# Patient Record
Sex: Male | Born: 1935 | Race: White | Hispanic: No | Marital: Married | Smoking: Never smoker
Health system: Southern US, Community
[De-identification: ages and names within clinical notes are randomized; demographics above are authoritative.]

## PROBLEM LIST (undated history)

## (undated) DIAGNOSIS — F0280 Dementia in other diseases classified elsewhere without behavioral disturbance: Secondary | ICD-10-CM

## (undated) DIAGNOSIS — M1990 Unspecified osteoarthritis, unspecified site: Secondary | ICD-10-CM

## (undated) DIAGNOSIS — G301 Alzheimer's disease with late onset: Secondary | ICD-10-CM

## (undated) DIAGNOSIS — K5790 Diverticulosis of intestine, part unspecified, without perforation or abscess without bleeding: Secondary | ICD-10-CM

## (undated) DIAGNOSIS — Z86010 Personal history of colonic polyps: Secondary | ICD-10-CM

## (undated) DIAGNOSIS — N200 Calculus of kidney: Secondary | ICD-10-CM

## (undated) DIAGNOSIS — C61 Malignant neoplasm of prostate: Secondary | ICD-10-CM

## (undated) DIAGNOSIS — J329 Chronic sinusitis, unspecified: Secondary | ICD-10-CM

## (undated) DIAGNOSIS — I10 Essential (primary) hypertension: Secondary | ICD-10-CM

## (undated) DIAGNOSIS — Z860101 Personal history of adenomatous and serrated colon polyps: Secondary | ICD-10-CM

## (undated) DIAGNOSIS — IMO0002 Reserved for concepts with insufficient information to code with codable children: Secondary | ICD-10-CM

## (undated) HISTORY — DX: Personal history of colonic polyps: Z86.010

## (undated) HISTORY — DX: Malignant neoplasm of prostate: C61

## (undated) HISTORY — PX: TONSILLECTOMY AND ADENOIDECTOMY: SUR1326

## (undated) HISTORY — DX: Reserved for concepts with insufficient information to code with codable children: IMO0002

## (undated) HISTORY — DX: Alzheimer's disease with late onset: G30.1

## (undated) HISTORY — DX: Calculus of kidney: N20.0

## (undated) HISTORY — DX: Diverticulosis of intestine, part unspecified, without perforation or abscess without bleeding: K57.90

## (undated) HISTORY — PX: SKIN CANCER EXCISION: SHX779

## (undated) HISTORY — DX: Personal history of adenomatous and serrated colon polyps: Z86.0101

## (undated) HISTORY — PX: APPENDECTOMY: SHX54

## (undated) HISTORY — DX: Chronic sinusitis, unspecified: J32.9

## (undated) HISTORY — PX: PROSTATECTOMY: SHX69

## (undated) HISTORY — PX: OTHER SURGICAL HISTORY: SHX169

## (undated) HISTORY — PX: CATARACT EXTRACTION: SUR2

## (undated) HISTORY — PX: TOTAL KNEE ARTHROPLASTY: SHX125

## (undated) HISTORY — PX: KNEE SURGERY: SHX244

## (undated) HISTORY — DX: Unspecified osteoarthritis, unspecified site: M19.90

## (undated) HISTORY — DX: Dementia in other diseases classified elsewhere, unspecified severity, without behavioral disturbance, psychotic disturbance, mood disturbance, and anxiety: F02.80

## (undated) HISTORY — DX: Essential (primary) hypertension: I10

---

## 2002-11-06 ENCOUNTER — Encounter: Payer: Self-pay | Admitting: Orthopedic Surgery

## 2002-11-12 ENCOUNTER — Encounter: Payer: Self-pay | Admitting: Orthopedic Surgery

## 2002-11-12 ENCOUNTER — Inpatient Hospital Stay (HOSPITAL_COMMUNITY): Admission: RE | Admit: 2002-11-12 | Discharge: 2002-11-17 | Payer: Self-pay | Admitting: Orthopedic Surgery

## 2002-11-15 ENCOUNTER — Encounter: Payer: Self-pay | Admitting: Orthopedic Surgery

## 2002-11-16 ENCOUNTER — Encounter: Payer: Self-pay | Admitting: Orthopedic Surgery

## 2002-12-24 ENCOUNTER — Inpatient Hospital Stay (HOSPITAL_COMMUNITY): Admission: RE | Admit: 2002-12-24 | Discharge: 2002-12-28 | Payer: Self-pay | Admitting: Orthopedic Surgery

## 2003-11-27 ENCOUNTER — Ambulatory Visit (HOSPITAL_COMMUNITY): Admission: RE | Admit: 2003-11-27 | Discharge: 2003-11-27 | Payer: Self-pay | Admitting: Chiropractic Medicine

## 2005-01-14 ENCOUNTER — Ambulatory Visit: Payer: Self-pay | Admitting: Internal Medicine

## 2005-02-08 ENCOUNTER — Encounter (INDEPENDENT_AMBULATORY_CARE_PROVIDER_SITE_OTHER): Payer: Self-pay | Admitting: Specialist

## 2005-02-08 ENCOUNTER — Ambulatory Visit: Payer: Self-pay | Admitting: Internal Medicine

## 2006-03-28 ENCOUNTER — Emergency Department (HOSPITAL_COMMUNITY): Admission: EM | Admit: 2006-03-28 | Discharge: 2006-03-29 | Payer: Self-pay | Admitting: Emergency Medicine

## 2007-12-05 ENCOUNTER — Ambulatory Visit: Payer: Self-pay | Admitting: Urology

## 2008-12-09 ENCOUNTER — Ambulatory Visit (HOSPITAL_COMMUNITY): Admission: RE | Admit: 2008-12-09 | Discharge: 2008-12-09 | Payer: Self-pay | Admitting: Urology

## 2009-09-20 ENCOUNTER — Emergency Department (HOSPITAL_COMMUNITY): Admission: EM | Admit: 2009-09-20 | Discharge: 2009-09-20 | Payer: Self-pay | Admitting: Emergency Medicine

## 2010-02-11 ENCOUNTER — Encounter: Payer: Self-pay | Admitting: Internal Medicine

## 2010-03-05 NOTE — Letter (Signed)
Summary: Colonoscopy Letter  [facility]  [address] [place], [place] [address]   Phone: [phone]  Fax: [phone]      February 11, 2010 MRN: [id number]   [facility] [address] [place], [place]  [address]   Dear Mr. Molla,   According to your medical record, it is time for you to schedule a Colonoscopy. The American Cancer Society recommends this procedure as a method to detect early colon cancer. Patients with a family history of colon cancer, or a personal history of colon polyps or inflammatory bowel disease are at increased risk.  This letter has been generated based on the recommendations made at the time of your procedure. If you feel that in your particular situation this may no longer apply, please contact our office.  Please call our office at [phone] to schedule this appointment or to update your records at your earliest convenience.  Thank you for cooperating with [place] to provide you with the very best care possible.   Sincerely,  Hedwig Morton. Juanda Chance, M.D.  [facility] [phone]

## 2010-06-19 NOTE — Op Note (Signed)
NAMEMANSOOR, HILLYARD                          ACCOUNT NO.:  [id number]   MEDICAL RECORD NO.:  [id number]                   PATIENT TYPE:  INP   LOCATION:  2550                                 FACILITY:  [facility]   PHYSICIAN:  Loreta Ave, M.D.              DATE OF BIRTH:  [birth year]   DATE OF PROCEDURE:  11/12/2002  DATE OF DISCHARGE:                                 OPERATIVE REPORT   PREOPERATIVE DIAGNOSES:  End-stage degenerative arthritis with marked varus  alignment and flexion contracture of right knee.   POSTOPERATIVE DIAGNOSES:  End-stage degenerative arthritis with marked varus  alignment and flexion contracture of right knee.   PROCEDURES:  Right total knee replacement, Osteonics prosthesis, soft tissue  balancing with medial capsular and posterior capsular release, a cemented #9  posterior stabilized femoral component, cemented #11 tibial component with  12 mm posterior stabilized Flex polyethylene insert, cemented recessed 28 mm  patellar component.   SURGEON:  Loreta Ave, M.D.   ASSISTANT:  Arlys John D. Petrarca, P.A.-C.   ANESTHESIA:  General.   ESTIMATED BLOOD LOSS:  Minimal.   TOURNIQUET TIME:  One hour and 30 minutes.   SPECIMENS:  Bone and soft tissue.   CULTURES:  None.   COMPLICATIONS:  None.   DRESSING:  Soft compressive, knee immobilizer.   DRAINS:  Hemovac x2.   DESCRIPTION OF PROCEDURE:  The patient was brought to the operating room and  placed on the operating room table in the supine position.  After adequate  anesthesia had been obtained, the right knee was examined.  Ten degrees of  varus, ten degrees of flexion contracture, both relatively fixed.  Further  flexion to a little less than 90 degrees.  The tourniquet was applied.  Prepped and draped in the usual sterile fashion.  Exsanguination with an  Esmarch tourniquet inflated to 350 mmHg.  A straight incision above the  patella, down to the tibial tubercle.  The skin and  subcutaneous tissue  divided.  A medial arthrotomy.  The knee exposed with marked spurring and  loose bodies, and capsular debris and remnants of menisci.  The cruciate  ligament was all removed.  A generous medial capsular release.  The distal  femur was exposed.  Intramedullary guide placed.  The distal cup removing 5  mm, set at 5 degrees of valgus.  Sized to a #9 component.  The jigs were put  in place.  Definite cuts made.  A trial put in place and found to fit well.  The tibia was exposed.  The tibial spine removed with a saw.  The  periarticular spurs were removed.  The proximal cut, 5 degree posterior  slope cut, bringing it down just below the sclerotic chondral area of bone  loss at the medial plateau.  Able to keep the section above the fibular  head.  Once the cut was completely excised, a #11 component.  The patella  was then sized, reamed, and drilled for a 28 mm component.  The trials put  in place throughout.  With the 12 mm insert, and after the removal of all  spurs and loose bodies, a medial capsular release, as well as posterior  capsular release, I had full extension, full flexion, nicely balanced knee  set at 5 degrees of valgus, with acceptable femoral tracking.  The tibia was  marked for appropriate rotation and reamed.  All trials were removed.  Copious irrigation with the pulse irrigating device.  Cement prepared and  placed on all components, to ensure that they were firmly seated, with a  polyethylene attached to the tibial component.  The knee was reduced.  Once  the cement hardened, the knee was re-examined.  Full extension, full  flexion.  No lift-off of the flexion, and it had good patellofemoral  tracking.  A Hemovac was placed and brought out through a separate stab  wound.  The arthrotomy was closed with a #1 Vicryl.  The skin and  subcutaneous tissue with Vicryl and staples.  The margins of the wounds of  the knee were injected with Marcaine.  A Hemovac  was clamped.  A sterile  compressive dressing applied.  The tourniquet deflated and removed.  A knee  immobilizer applied.  The anesthesia was reversed.  The patient was brought  to the recovery room.  He tolerated the surgery well with no complications.                                               Loreta Ave, M.D.    DFM/MEDQ  D:  11/12/2002  T:  11/12/2002  Job:  [id number]

## 2010-06-19 NOTE — Op Note (Signed)
Casey Moore, Casey Moore                        ACCOUNT NO.:  [id number]   MEDICAL RECORD NO.:  [id number]                   PATIENT TYPE:  INP   LOCATION:  2550                                 FACILITY:  [facility]   PHYSICIAN:  Loreta Ave, M.D.              DATE OF BIRTH:  Jan 05, 1932   DATE OF PROCEDURE:  12/24/2002  DATE OF DISCHARGE:                                 OPERATIVE REPORT   PREOPERATIVE DIAGNOSES:  Degenerative arthritis with flexion contracture,  varus alignment, and reduced flexion of the left knee.   POSTOPERATIVE DIAGNOSES:  Degenerative arthritis with flexion contracture,  varus alignment, and reduced flexion of the left knee.   OPERATIVE PROCEDURE:  Left total knee replacement utilizing Osteonics  prosthesis.  Soft tissue balancing with medial capsular and lateral  retinacular release.  Cemented #9 posterior-stabilized femoral component.  Cemented #11 tibial component with 12-mm posterior-stabilized flex insert.  Cemented recessed 28-mm patellar component.   SURGEON:  Loreta Ave, M.D.   ASSISTANT:  Arlys John D. Petrarca, P.A.-C.   ANESTHESIA:  General.   BLOOD LOSS:  Minimal.   TOURNIQUET TIME:  One hour and 20 minutes.   SPECIMENS:  Excised bone and soft tissue.   CULTURES:  None.   COMPLICATIONS:  None.   DRESSING:  Soft compressive with knee immobilizer.   DRAIN:  Hemovac x2.   PROCEDURE:  The patient was brought to the operating room and after adequate  anesthesia had been obtained, the left knee was examined.  A 5-degree  flexion contracture, 5-degree varus not event correctable to neutral.  Stable ligaments with further flexion to 90 degrees.  Tourniquet applied,  prepped and draped in the usual sterile fashion.  Exsanguinated with  elevation and an Esmarch, tourniquet inflated to 350 mmHg.  Straight  incision above the patella down to the tibial tubercle.  Hemostasis with  cautery.  Medial parapatellar arthrotomy.  Grade 4 changes  throughout.  Remnants of menisci, cruciate ligaments, para-articular spurs, loose bodies,  hypertrophic synovitis all debrided.  Distal femur exposed.  Intramedullary  guide placed.  Relatively wide.  Distal cuts set at 5 degrees of valgus,  removing 10 mm.  Sized for a #9 component.  Although this was narrow medial  to lateral, it was the exact right size from anterior to posterior.  Jigs  put in place and definitive cuts made.  Trial for the #9 component fit well.  Trial removed.  Attention turned to the tibia.  Tibial spine removed with  the saw.  Intramedullary guide placed.  Proximal cut at 5 degrees of  posterior slope removing 4 mm off the deficient medial side.  Patella sized  using the drill for a 28-mm component after para-articular spurs removed.  At completion, trials put in place.  A #9 on the femur which fit well, a #11  on the tibia which had good sizing.  A 28 mm on the patella, with  the 12-mm  flex posterior-stabilized insert, I had full extension and flexion nicely,  balance of knee set up 5 degrees of valgus after medial capsular release.  Lateral retinacular release performed with cautery from inside out.  It was  necessary to balance the patellofemoral joint.  Once completed, I had good  tracking.  Tibia was marked for appropriate rotation and then reamed.  All  trials removed.  Copious irrigation with the pulsed irrigating device.  Cement prepared, placed on all components which were firmly seated,  excessive cement removed.  Polyethylene attached to the tibia.  Knee  reduced.  Once the cement had hardened, the knee was re-examined.  Full  extension and full flexion, a nicely balanced knee with good stability and  good patellofemoral tracking.  Hemovacs placed and brought out through a  separate stab wound.  The wound was closed with #1 Vicryl, skin and  subcutaneous tissue with Vicryl and staples.  The Hemovacs were clamped.  The knee incision was injected with  Marcaine.  Sterile compressive dressing  applied.  Tourniquet inflator removed.  Knee immobilizer applied.  Anesthesia reversed.  Brought to the recovery room.  He tolerated the  surgery well with no complications.                                               Loreta Ave, M.D.    DFM/MEDQ  D:  12/24/2002  T:  12/24/2002  Job:  [id number]

## 2010-06-19 NOTE — Discharge Summary (Signed)
Casey Moore, Casey Moore                        ACCOUNT NO.:  [id number]   MEDICAL RECORD NO.:  [id number]                   PATIENT TYPE:  INP   LOCATION:  5035                                 FACILITY:  [facility]   PHYSICIAN:  Loreta Ave, M.D.              DATE OF BIRTH:  [birth year]   DATE OF ADMISSION:  12/24/2002  DATE OF DISCHARGE:  12/28/2002                                 DISCHARGE SUMMARY   ADMISSION DIAGNOSIS:  Advanced degenerative joint disease of the left knee.   DISCHARGE DIAGNOSES:  1. Advanced degenerative joint disease of the left knee.  2. History of hypertension.  3. Status post right total knee replacement.   PROCEDURE:  Left total knee replacement.   HISTORY OF PRESENT ILLNESS:  A 77 year old white male status post right  total knee replacement with progressive pain and discomfort in the left  knee.  He has continued to have pain with difficulty with activities of  daily living on the left knee.  He is having nighttime pain.  He has failed  with conservative treatment.  He is now indicated for a left total knee  replacement.   HOSPITAL COURSE:  A 77 year old white male admitted December 24, 2002, after  appropriate laboratory studies were obtained as well as 1 g of Ancef IV on  call to the operating room, was taken to the operating room where he  underwent a left total knee replacement.  He tolerated the procedure well.  He was placed on a low dose Dilaudid PCA pump.  Ancef 1 g IV q.8h. x3 doses  was given.  Heparin 5000 units subcu q.12h. was given until his Coumadin  became therapeutic.  Foley was placed intraoperatively.  Hemovacs were also  placed intraoperatively.  Consultations with PT and OT were made.  Ambulation weightbearing as tolerated on the left.  CPM 0 to 30 degrees for  eight hours per day.   He was allowed out of bed to a chair the following day.  Weaned to oral pain  medications over the first two postoperative days.  He was placed with  30 mL  of Maalox p.r.n. indigestion.  Robaxin 500 mg q.6-8h. p.r.n. spasms.   His Hemovacs were pulled on December 26, 2002, and his dressings were  changed.  He had an otherwise unremarkable hospital course.   He was discharged on December 28, 2002.  He will return for follow-up in the  next 10 to 14 days.   Radiographic studies of the left knee reveals normal alignment following  left knee arthroplasty.   LABORATORY DATA:  Admitted with hemoglobin of 12.5, hematocrit 36.5%, white  count 6200, platelets 248,000.  Discharge hemoglobin 7.9, hematocrit 23.2,  white count 6700, platelets 211,000.  Discharge protime was 18.5 with an INR  of 1.9.  Preoperative sodium 134, potassium 3.6, chloride 101, CO2 24,  glucose 138, BUN 19, creatinine 1.1, calcium 9.6.  Total protein  6.9,  albumin 3.7, AST 24, ALT 17, ALP 141, total bilirubin 0.6.  Serum osmolality  was 290.  Discharge sodium 134, potassium 4.4, chloride 100, CO2 28, glucose  130, BUN 10, creatinine 0.9, calcium 9.0.  Urinalysis showed 100 protein,  otherwise unremarkable except for hyalin casts.  Blood type O positive,  antibody screen negative.   DISCHARGE INSTRUCTIONS:  1. Given a prescription for Percocet 5/325 one to two tablets every four     hours as needed for pain.  2. Coumadin 5 mg daily as directed by [facility].  3. Colace 100 mg twice a day.  4. Iron sulfate 325 mg twice a day with meals.  5. Activity as taught in physical therapy.  6. No restrictions on diet.  7. Keep his wound clean and dry and cover daily with a dressing.  8. Follow blue instruction sheet.  9. Follow-up in the office in 10 to 14 days for recheck evaluation.      Casey Moore, P.A.-C.                Loreta Ave, M.D.    BDP/MEDQ  D:  01/14/2003  T:  01/15/2003  Job:  [id number]

## 2010-06-19 NOTE — Discharge Summary (Signed)
Casey Moore, Casey Moore                        ACCOUNT NO.:  [id number]   MEDICAL RECORD NO.:  [id number]                   PATIENT TYPE:  INP   LOCATION:  5025                                 FACILITY:  [facility]   PHYSICIAN:  Loreta Ave, M.D.              DATE OF BIRTH:  12-Dec-1931   DATE OF ADMISSION:  11/12/2002  DATE OF DISCHARGE:  11/17/2002                                 DISCHARGE SUMMARY   ADMISSION DIAGNOSES:  Advanced degenerative joint disease of the right knee.   DISCHARGE DIAGNOSES:  1. Advanced degenerative joint disease of the right knee.  2. History of hypertension.  3. Prostate carcinoma.   PROCEDURE:  Right total knee replacement.   HISTORY:  A 77 year old white male with progressive pain and discomfort  right knee.  He is now having difficulty with activities of daily living as  well as recreational activities.  Also, is noted to have nighttime and rest  pain.  He has failed with conservative treatment.  He is now indicated for  total knee replacement on the right.   HOSPITAL COURSE:  A 77 year old white male admitted November 12, 2002 after  appropriate laboratory studies were obtained as well as 1 g Ancef IV on-call  to the operating room.  Was taken to the operating room where he underwent a  right total knee replacement.  He tolerated the procedure well.  He was  placed on a low dose Dilaudid PCA pump.  Ancef 1 g Iv q.8h. x3 doses was  continued.  Heparin 5000 units subcutaneous q.12h. until Coumadin became  therapeutic.  Consultation with PT, OT, and rehabilitation.  Ambulation,  weightbearing as tolerated on the right.  CPM 0-60 degrees 8-10 hours per  day.  Foley was placed intraoperatively.  He was allowed out of bed to a  chair the following day.  Robaxin 500 mg p.o. q.6-8h. p.r.n. was used for  spasms.  Mylanta 30 mL p.o. p.r.n. indigestion was also used.  On the 13th  his Hemovacs were pulled as well as his Foley.  His IV was heplocked.  He  also  had a hemoglobin A1C drawn on that day.  On the 14th he did develop a  mild ileus with some hypokalemia.  Senokot was used one p.o. b.i.d. one-half  hour a.c.  Also, a KUB was ordered.  Potassium chloride of 20 mEq p.o.  b.i.d. was started x24 hours.  The following day he was placed on Reglan 10  mg p.o. t.i.d.  Chest x-ray and KUB were also ordered.  A urinalysis clean  catch was also ordered for temperature elevation.  He had improvement in his  ileus as well as his temperature.  He was discharged on the 16th in improved  condition.  He will follow back up in our office in 10-14 days for recheck  evaluation.  EKG revealed normal sinus rhythm.  Radiographic studies from  November 12, 2002 right knee revealed good position AP and lateral right  total knee portable views.  October 14 abdominal series revealed findings  are most compatible with a moderate ileus.  October 15 chest x-ray revealed  minimal linear scarring or atelectasis at the lung bases.  His abdominal  film showed some interval decrease in small bowel and colonic distention  since November 15, 2002.   Laboratory studies:  Admitted with a hemoglobin 13.6, hematocrit 39.8%,  white count 6000, platelets 206,000.  Discharge pro time was 25.0 with an  INR of 3.3.  Preoperative chemistries:  Sodium 145, potassium 4.5, chloride  110, CO2 25, glucose 89, BUN 28, creatinine 1.0, calcium 10.0.  Total  protein 6.9, albumin 4.1, AST 35, ALT 30, ALP 194, total bilirubin 0.6.  Serum osmolality was 298.  Discharge sodium 135, potassium 3.8, chloride  100, CO2 26, glucose 123, BUN 20, creatinine 1.0, calcium 9.0.  Total  protein 5.8, albumin 2.5, AST 32, ALT 23, ALP 164, total bilirubin 0.7.  Glycosylated hemoglobin was 5.3.  Urinalysis October 5 was benign.  October  15 revealed small amount of bilirubin and hemoglobin, 15 ketones, protein  100, urobilinogen 0.2, wbc's 0-2, red 0-2, bacteria rare, and there was  mucus noted.  Blood type O+.   Antibody screen negative.   DISCHARGE INSTRUCTIONS:   DISCHARGE MEDICATIONS:  1. Given a prescription for Percocet 5/325 one to two tablets p.o. q.4h.     p.r.n. pain.  2. Coumadin 5 mg p.o. as directed by Casey Moore.  3. Colace 100 mg p.o. b.i.d.  4. Iron sulfate 325 mg b.i.d. with food.   ACTIVITY:  As taught in physical therapy.   DIET:  No restrictions.   WOUND CARE:  Keep his wound clean and dry and cover with a dressing daily.  Follow the blue instruction sheet.   FOLLOWUP:  Follow back up with [place] in 10-14 days for recheck evaluation.      Oris Drone Petrarca, P.A.-C.                Loreta Ave, M.D.    BDP/MEDQ  D:  01/14/2003  T:  01/15/2003  Job:  [id number]

## 2010-09-10 LAB — HEMOCCULT GUIAC POC 1CARD (OFFICE)

## 2010-10-13 ENCOUNTER — Encounter: Payer: Self-pay | Admitting: Internal Medicine

## 2010-10-29 ENCOUNTER — Encounter: Payer: Self-pay | Admitting: Internal Medicine

## 2010-11-03 ENCOUNTER — Ambulatory Visit (INDEPENDENT_AMBULATORY_CARE_PROVIDER_SITE_OTHER): Payer: 59 | Admitting: Internal Medicine

## 2010-11-03 ENCOUNTER — Encounter: Payer: Self-pay | Admitting: Internal Medicine

## 2010-11-03 VITALS — BP 110/60 | HR 60 | Ht 68.5 in | Wt 198.2 lb

## 2010-11-03 DIAGNOSIS — Z86010 Personal history of colonic polyps: Secondary | ICD-10-CM

## 2010-11-03 DIAGNOSIS — R195 Other fecal abnormalities: Secondary | ICD-10-CM

## 2010-11-03 MED ORDER — PEG-KCL-NACL-NASULF-NA ASC-C 100 G PO SOLR: 1.0000 | Freq: Once | ORAL | Status: DC

## 2010-11-03 NOTE — Patient Instructions (Signed)
You have been scheduled for a colonoscopy. Please follow written instructions given to you at your visit today.  Please pick up your Moviprep kit at the pharmacy within the next 2-3 days. CC:Dr Richard [email or web address]

## 2010-11-03 NOTE — Progress Notes (Signed)
Casey Moore 04/24/1931 MRN [id number]     History of Present Illness:  This is a 77 year old white male with Hemoccult positive stool on a home test. He has a history of an adenomatous polyp of the colon on a colonoscopy in January 2007. It was an 8 mm tubular adenoma. He denies any symptoms of constipation, abdominal pain or visible rectal bleeding. He has a history of prostate cancer at least 20 years ago. He is status post radical prostatectomy. He denies having pelvic radiation. He has kidney stones, gout and senile dementia. His wife comes with him.   Past Medical History  Diagnosis Date  . Osteoarthritis   . Hypertension   . Hx of adenomatous colonic polyps   . Diverticulosis   . Prostate cancer   . Paget's disease   . Kidney stone   . Sinusitis   . SDAT (senile dementia of Alzheimer's type)    Past Surgical History  Procedure Date  . Cataract extraction     left-with implant  . Total knee arthroplasty     bilateral  . Mastoid surgery     x 5  . Tonsillectomy and adenoidectomy   . Appendectomy   . Knee surgery     bilateral  . Prostatectomy   . Skin cancer excision     right hip    reports that he has never smoked. He has never used smokeless tobacco. He reports that he drinks alcohol. He reports that he does not use illicit drugs. family history includes Dementia in his mother; Prostate cancer in his brother and father; and Throat cancer in his paternal grandfather.  There is no history of Colon cancer. No Known Allergies      Review of Systems: Denies dysphagia, odynophagia, shortness of breath or chest pain  The remainder of the 10 point ROS is negative except as outlined in H&P   Physical Exam: General appearance  Well developed, in no distress. Answers appropriately. Eyes- non icteric. HEENT nontraumatic, normocephalic. Mouth no lesions, tongue papillated, no cheilosis. Neck supple without adenopathy, thyroid not enlarged, no carotid bruits, no  JVD. Lungs Clear to auscultation bilaterally. Cor normal S1, normal S2, regular rhythm, no murmur,  quiet precordium. Abdomen: Soft, nontender with normal active bowel sounds. No distention. Liver edge at costal margin. Rectal: Not done. Extremities no pedal edema. Skin no lesions. Neurological alert and oriented x 3. Psychological normal mood and affect, Somewhat slow to answer questions, has a basic level of understanding.  Assessment and Plan:  Problem #1 Hemoccult positive stool on a home test. Patient is due for a recall colonoscopy because of his personal history of adenomatous polyps in 2007. He is a good candidate for a recall colonoscopy. He denies having pelvic radiation after his prostate cancer which could possibly explain heme positive stool. His hemoglobin last month was normal at 13.2 with a hematocrit 38.5 and MCV of 98. He is not taking any NSAID's and has no upper GI symptoms to suggest peptic ulcers. I have discussed with him and his wife the prep for colonoscopy, conscious sedation and the procedure itself. They would like to schedule it as soon as possible.   11/03/2010 Lina Sar

## 2010-12-01 ENCOUNTER — Ambulatory Visit (AMBULATORY_SURGERY_CENTER): Payer: 59 | Admitting: Internal Medicine

## 2010-12-01 ENCOUNTER — Encounter: Payer: Self-pay | Admitting: Internal Medicine

## 2010-12-01 DIAGNOSIS — D126 Benign neoplasm of colon, unspecified: Secondary | ICD-10-CM

## 2010-12-01 DIAGNOSIS — R195 Other fecal abnormalities: Secondary | ICD-10-CM

## 2010-12-01 DIAGNOSIS — Z86010 Personal history of colonic polyps: Secondary | ICD-10-CM

## 2010-12-01 DIAGNOSIS — Z1211 Encounter for screening for malignant neoplasm of colon: Secondary | ICD-10-CM

## 2010-12-01 MED ORDER — SODIUM CHLORIDE 0.9 % IV SOLN: 500.0000 mL | INTRAVENOUS | Status: DC

## 2010-12-01 NOTE — Progress Notes (Signed)
Pressure applied to abdomen to reach cecum. Turned onto back for 1 minute to reach cecum then returned to left lateral. Pt baseline rate 40's, with procedure heart rate dropped into 30's, pt diaphoretic but color remains good. Respiratory rate and bp and sats stable.

## 2010-12-01 NOTE — Patient Instructions (Signed)
Follow the discharge instructions on the [facility] [employer].  Continue your medications.  Await pathology results. Dr. Juanda Chance will send you a letter regarding results in one week to 10 days.

## 2010-12-02 ENCOUNTER — Telehealth: Payer: Self-pay | Admitting: *Deleted

## 2010-12-02 NOTE — Telephone Encounter (Signed)
Called home number, hung up on this caller.

## 2010-12-07 ENCOUNTER — Encounter: Payer: Self-pay | Admitting: Internal Medicine

## 2011-02-09 ENCOUNTER — Other Ambulatory Visit: Payer: Self-pay | Admitting: Dermatology

## 2011-06-27 IMAGING — US US RENAL
1 series · 14 of 25 positions shown · non-contrast
Comparison: None available.

CLINICAL DATA: Uric acid calculus.  Nephrolithiasis.

RENAL/URINARY TRACT ULTRASOUND COMPLETE

[Series 1: us renal · 0.30mm/px · 14 of 31 slices shown]
[im 1/31]
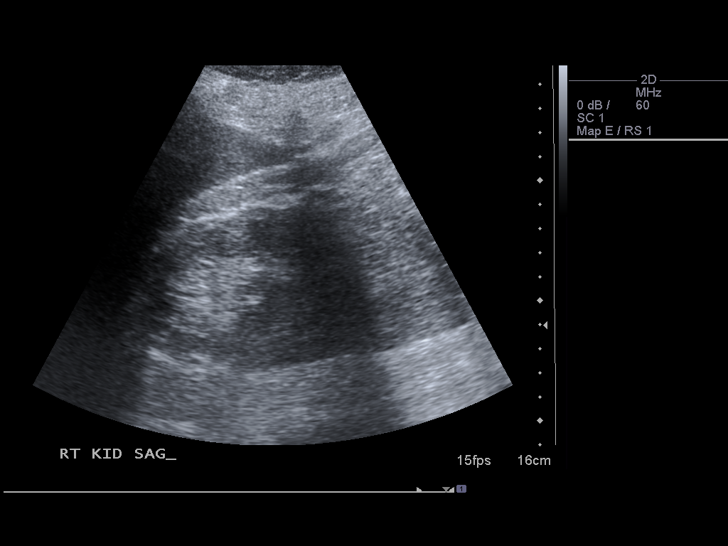
[im 3/31]
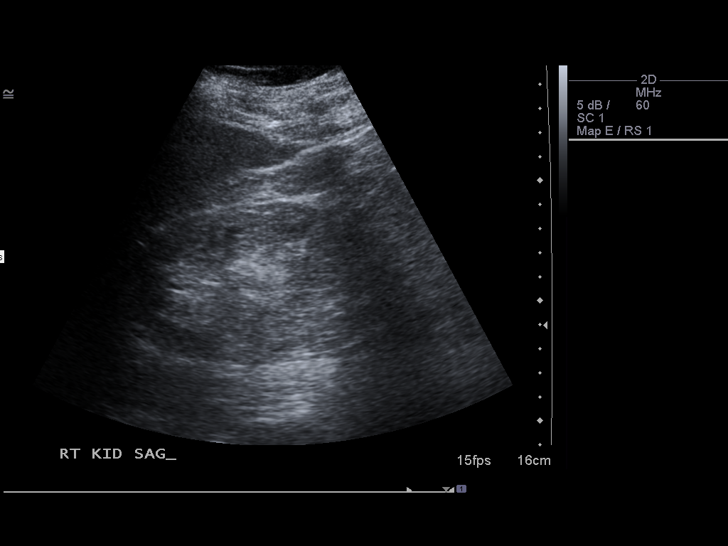
[im 6/31]
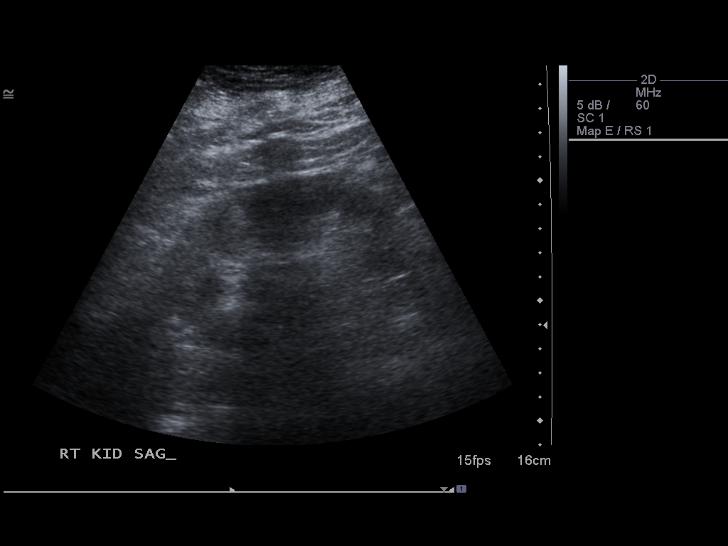
[im 8/31]
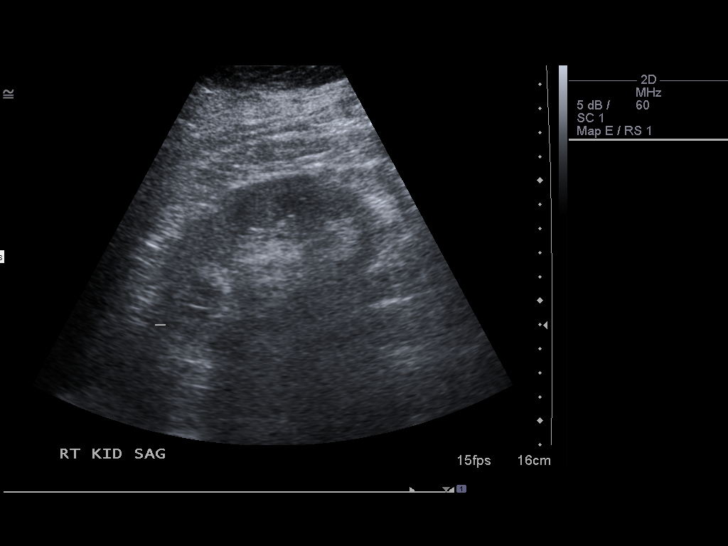
[im 11/31]
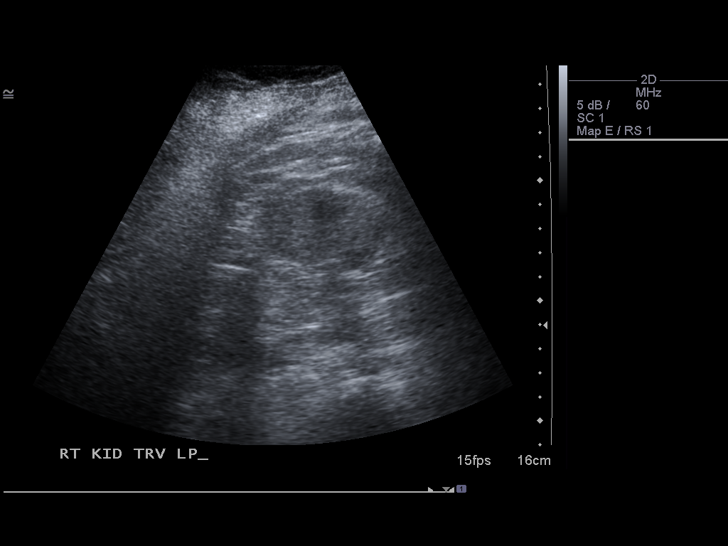
[im 12/31]
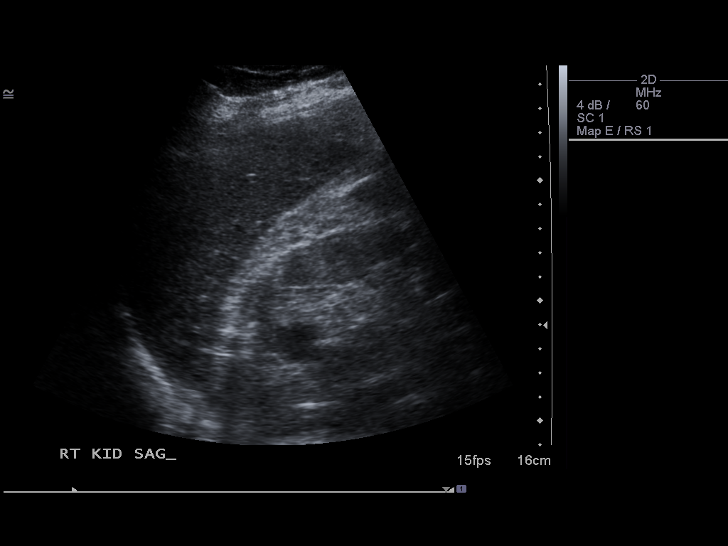
[im 14/31]
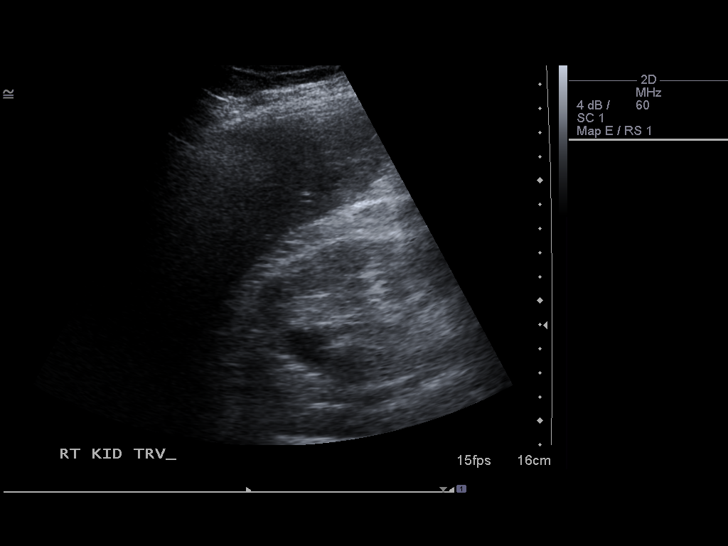
[im 17/31]
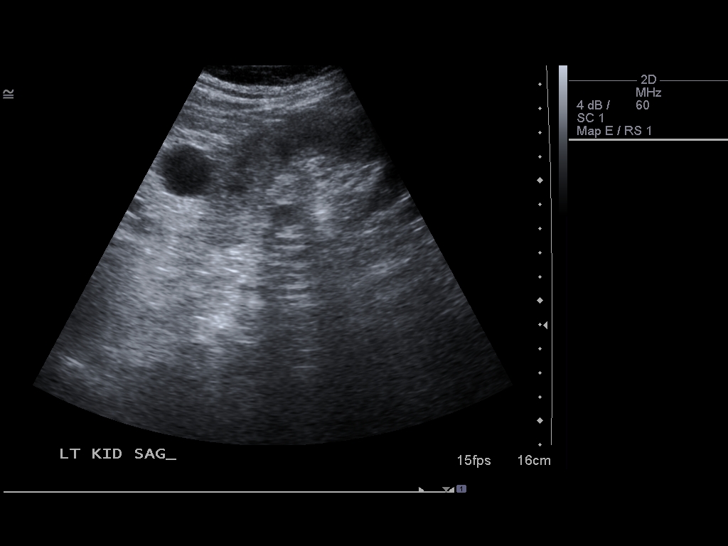
[im 19/31]
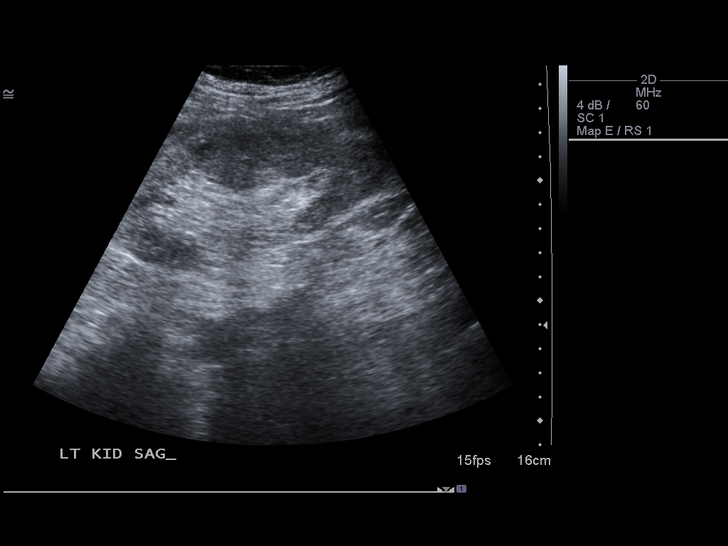
[im 21/31]
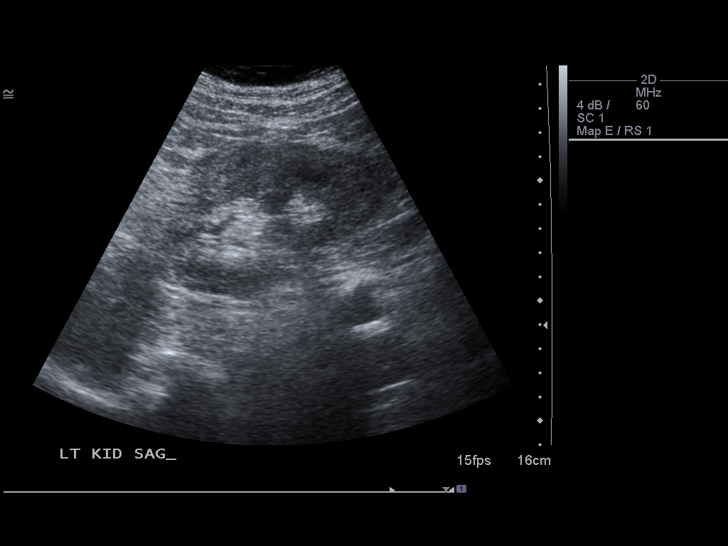
[im 23/31]
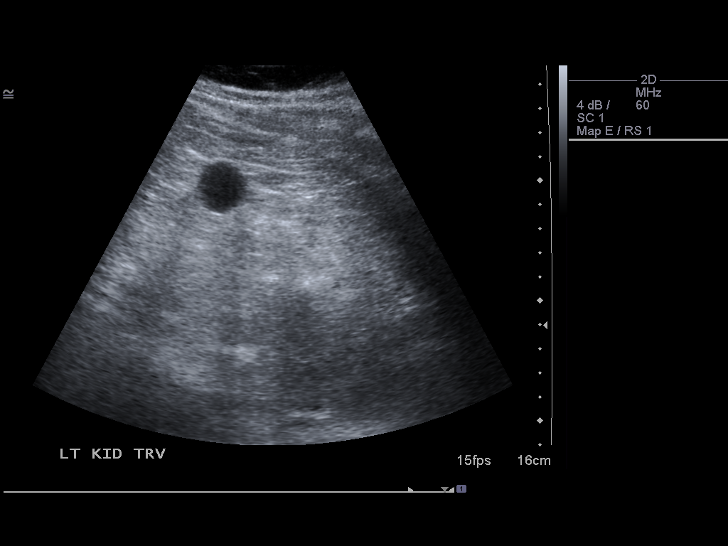
[im 26/31]
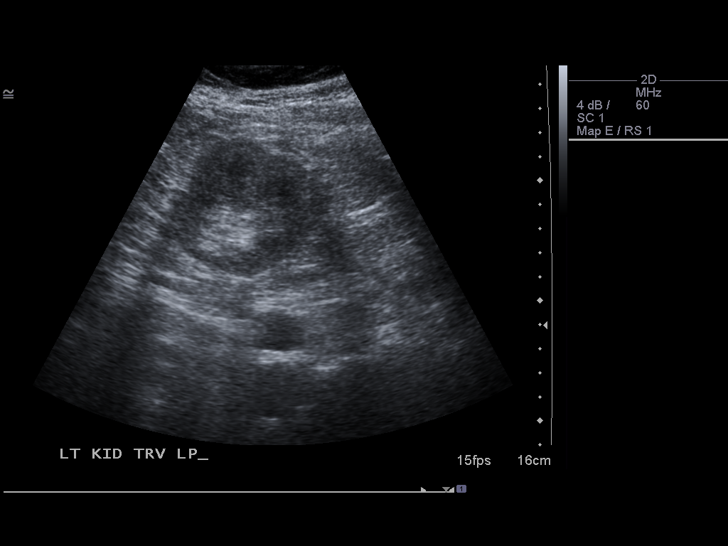
[im 28/31]
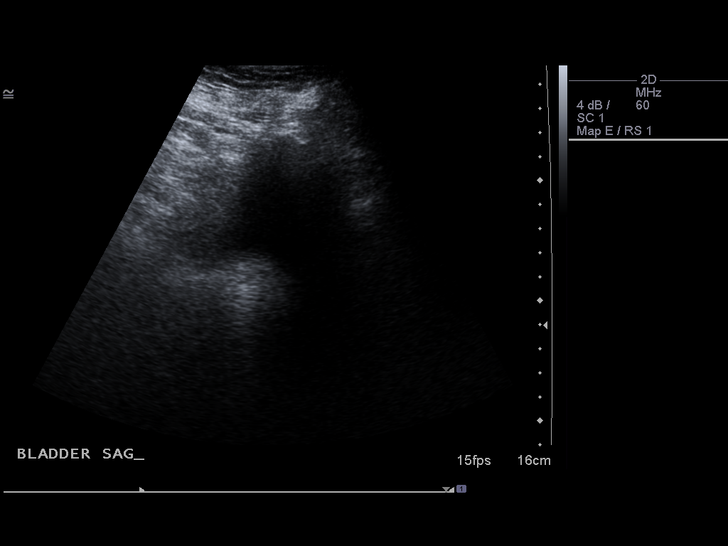
[im 31/31]
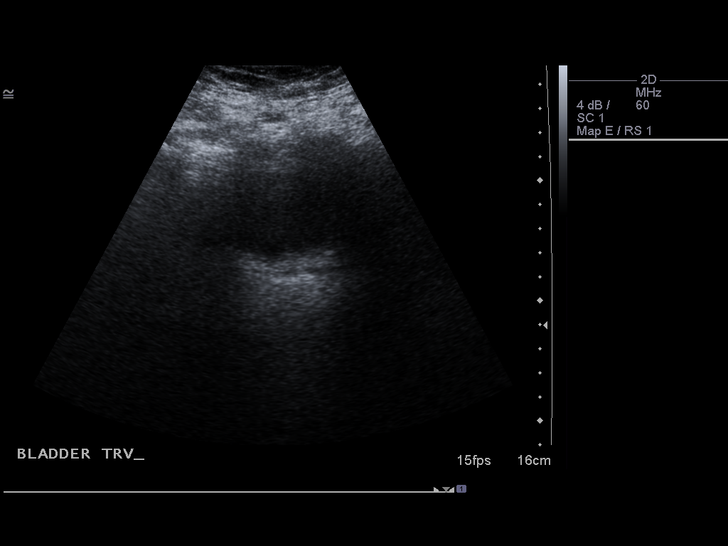

[14 of 25 positions shown; findings below may reference images not displayed]

FINDINGS: Right Kidney:  11.3 cm.  Upper polar 2.2 x 2.4 x 2.1 cm simple
exophytic renal cyst.  Increased through transmission with no
internal echoes. No calculi identified.

Left Kidney:  12.1 cm.  2.0 cm x 1.8 x 1.8 cm left upper pole
simple renal cyst. No calculi present.

Bladder:  Within normal limits.  Partially decompressed.
IMPRESSION: Bilateral renal cysts.  No urinary calculi identified.

## 2011-08-30 ENCOUNTER — Other Ambulatory Visit: Payer: Self-pay | Admitting: Dermatology

## 2011-10-28 ENCOUNTER — Other Ambulatory Visit (HOSPITAL_COMMUNITY): Payer: Self-pay | Admitting: Internal Medicine

## 2011-10-28 DIAGNOSIS — S0990XA Unspecified injury of head, initial encounter: Secondary | ICD-10-CM

## 2011-11-02 ENCOUNTER — Ambulatory Visit (HOSPITAL_COMMUNITY)
Admission: RE | Admit: 2011-11-02 | Discharge: 2011-11-02 | Disposition: A | Discharge status: Home / Self Care | Payer: 59 | Source: Ambulatory Visit | Attending: Internal Medicine | Admitting: Internal Medicine

## 2011-11-02 DIAGNOSIS — J323 Chronic sphenoidal sinusitis: Secondary | ICD-10-CM | POA: Insufficient documentation

## 2011-11-02 DIAGNOSIS — G9389 Other specified disorders of brain: Secondary | ICD-10-CM | POA: Insufficient documentation

## 2011-11-02 DIAGNOSIS — S0990XA Unspecified injury of head, initial encounter: Secondary | ICD-10-CM

## 2013-01-14 ENCOUNTER — Emergency Department (HOSPITAL_COMMUNITY): Payer: 59

## 2013-01-14 ENCOUNTER — Encounter (HOSPITAL_COMMUNITY): Payer: Self-pay | Admitting: Emergency Medicine

## 2013-01-14 ENCOUNTER — Inpatient Hospital Stay (HOSPITAL_COMMUNITY)
Admission: EM | Admit: 2013-01-14 | Discharge: 2013-01-17 | DRG: 086 | Disposition: A | Discharge status: Home Health | Payer: 59 | Attending: Surgery | Admitting: Surgery

## 2013-01-14 DIAGNOSIS — F0281 Dementia in other diseases classified elsewhere with behavioral disturbance: Secondary | ICD-10-CM | POA: Diagnosis present

## 2013-01-14 DIAGNOSIS — F02818 Dementia in other diseases classified elsewhere, unspecified severity, with other behavioral disturbance: Secondary | ICD-10-CM | POA: Diagnosis present

## 2013-01-14 DIAGNOSIS — M889 Osteitis deformans of unspecified bone: Secondary | ICD-10-CM | POA: Diagnosis present

## 2013-01-14 DIAGNOSIS — F0280 Dementia in other diseases classified elsewhere without behavioral disturbance: Secondary | ICD-10-CM | POA: Diagnosis present

## 2013-01-14 DIAGNOSIS — S022XXA Fracture of nasal bones, initial encounter for closed fracture: Secondary | ICD-10-CM

## 2013-01-14 DIAGNOSIS — S066XAA Traumatic subarachnoid hemorrhage with loss of consciousness status unknown, initial encounter: Principal | ICD-10-CM | POA: Diagnosis present

## 2013-01-14 DIAGNOSIS — S0120XA Unspecified open wound of nose, initial encounter: Secondary | ICD-10-CM | POA: Diagnosis present

## 2013-01-14 DIAGNOSIS — M1990 Unspecified osteoarthritis, unspecified site: Secondary | ICD-10-CM | POA: Diagnosis present

## 2013-01-14 DIAGNOSIS — S0181XA Laceration without foreign body of other part of head, initial encounter: Secondary | ICD-10-CM

## 2013-01-14 DIAGNOSIS — Z8546 Personal history of malignant neoplasm of prostate: Secondary | ICD-10-CM

## 2013-01-14 DIAGNOSIS — D649 Anemia, unspecified: Secondary | ICD-10-CM | POA: Diagnosis present

## 2013-01-14 DIAGNOSIS — Z8042 Family history of malignant neoplasm of prostate: Secondary | ICD-10-CM

## 2013-01-14 DIAGNOSIS — S066X9A Traumatic subarachnoid hemorrhage with loss of consciousness of unspecified duration, initial encounter: Principal | ICD-10-CM | POA: Diagnosis present

## 2013-01-14 DIAGNOSIS — S12100A Unspecified displaced fracture of second cervical vertebra, initial encounter for closed fracture: Secondary | ICD-10-CM | POA: Diagnosis present

## 2013-01-14 DIAGNOSIS — S0292XA Unspecified fracture of facial bones, initial encounter for closed fracture: Secondary | ICD-10-CM

## 2013-01-14 DIAGNOSIS — Z79899 Other long term (current) drug therapy: Secondary | ICD-10-CM

## 2013-01-14 DIAGNOSIS — I10 Essential (primary) hypertension: Secondary | ICD-10-CM | POA: Diagnosis present

## 2013-01-14 DIAGNOSIS — Z96659 Presence of unspecified artificial knee joint: Secondary | ICD-10-CM

## 2013-01-14 DIAGNOSIS — S0180XA Unspecified open wound of other part of head, initial encounter: Secondary | ICD-10-CM | POA: Diagnosis present

## 2013-01-14 DIAGNOSIS — Z9181 History of falling: Secondary | ICD-10-CM

## 2013-01-14 DIAGNOSIS — Z85828 Personal history of other malignant neoplasm of skin: Secondary | ICD-10-CM

## 2013-01-14 DIAGNOSIS — G309 Alzheimer's disease, unspecified: Secondary | ICD-10-CM | POA: Diagnosis present

## 2013-01-14 DIAGNOSIS — N179 Acute kidney failure, unspecified: Secondary | ICD-10-CM | POA: Diagnosis present

## 2013-01-14 DIAGNOSIS — Y9210 Unspecified residential institution as the place of occurrence of the external cause: Secondary | ICD-10-CM | POA: Diagnosis present

## 2013-01-14 DIAGNOSIS — W19XXXA Unspecified fall, initial encounter: Secondary | ICD-10-CM | POA: Diagnosis present

## 2013-01-14 LAB — CBC
HCT: 30.5 % — ABNORMAL LOW (ref 39.0–52.0)
Hemoglobin: 10.2 g/dL — ABNORMAL LOW (ref 13.0–17.0)
MCH: 32.1 pg (ref 26.0–34.0)
MCHC: 33.4 g/dL (ref 30.0–36.0)
MCV: 95.9 fL (ref 78.0–100.0)
RBC: 3.18 MIL/uL — ABNORMAL LOW (ref 4.22–5.81)

## 2013-01-14 LAB — COMPREHENSIVE METABOLIC PANEL
ALT: 13 U/L (ref 0–53)
BUN: 27 mg/dL — ABNORMAL HIGH (ref 6–23)
CO2: 24 mEq/L (ref 19–32)
Chloride: 107 mEq/L (ref 96–112)
Creatinine, Ser: 1.51 mg/dL — ABNORMAL HIGH (ref 0.50–1.35)
GFR calc non Af Amer: 42 mL/min — ABNORMAL LOW (ref >90)
Total Bilirubin: 0.3 mg/dL (ref 0.3–1.2)
Total Protein: 6.3 g/dL (ref 6.0–8.3)

## 2013-01-14 LAB — MRSA PCR SCREENING: MRSA by PCR: NEGATIVE

## 2013-01-14 MED ORDER — BACITRACIN-NEOMYCIN-POLYMYXIN OINTMENT TUBE
TOPICAL_OINTMENT | Freq: Two times a day (BID) | CUTANEOUS | Status: DC
  Administered 2013-01-14: 15:00:00 via TOPICAL
  Administered 2013-01-14: 1 via TOPICAL
  Administered 2013-01-15 – 2013-01-17 (×5): via TOPICAL
  Filled 2013-01-14: qty 15

## 2013-01-14 MED ORDER — LISINOPRIL 40 MG PO TABS
40.0000 mg | ORAL_TABLET | Freq: Every day | ORAL | Status: DC
  Administered 2013-01-15 – 2013-01-17 (×3): 40 mg via ORAL
  Filled 2013-01-14 (×4): qty 1

## 2013-01-14 MED ORDER — ONDANSETRON HCL 4 MG PO TABS: 4.0000 mg | ORAL_TABLET | Freq: Four times a day (QID) | ORAL | Status: DC | PRN

## 2013-01-14 MED ORDER — ONDANSETRON HCL 4 MG/2ML IJ SOLN
4.0000 mg | Freq: Four times a day (QID) | INTRAMUSCULAR | Status: DC | PRN
  Administered 2013-01-15: 4 mg via INTRAVENOUS
  Filled 2013-01-14: qty 2

## 2013-01-14 MED ORDER — HYDROCODONE-ACETAMINOPHEN 5-325 MG PO TABS: 1.0000 | ORAL_TABLET | ORAL | Status: DC | PRN

## 2013-01-14 MED ORDER — MORPHINE SULFATE 2 MG/ML IJ SOLN
2.0000 mg | INTRAMUSCULAR | Status: DC | PRN
  Administered 2013-01-14: 4 mg via INTRAVENOUS
  Filled 2013-01-14: qty 2

## 2013-01-14 MED ORDER — ALLOPURINOL 300 MG PO TABS
300.0000 mg | ORAL_TABLET | Freq: Every day | ORAL | Status: DC
  Administered 2013-01-15 – 2013-01-17 (×3): 300 mg via ORAL
  Filled 2013-01-14 (×4): qty 1

## 2013-01-14 MED ORDER — LORAZEPAM 2 MG/ML IJ SOLN
1.0000 mg | Freq: Once | INTRAMUSCULAR | Status: AC
  Administered 2013-01-14: 1 mg via INTRAMUSCULAR

## 2013-01-14 MED ORDER — ATORVASTATIN CALCIUM 20 MG PO TABS
20.0000 mg | ORAL_TABLET | Freq: Every day | ORAL | Status: DC
  Administered 2013-01-15 – 2013-01-16 (×2): 20 mg via ORAL
  Filled 2013-01-14 (×4): qty 1

## 2013-01-14 MED ORDER — LORAZEPAM 2 MG/ML IJ SOLN
1.0000 mg | Freq: Once | INTRAMUSCULAR | Status: DC
  Filled 2013-01-14: qty 1

## 2013-01-14 MED ORDER — PANTOPRAZOLE SODIUM 40 MG IV SOLR
40.0000 mg | Freq: Every day | INTRAVENOUS | Status: DC
  Administered 2013-01-14: 40 mg via INTRAVENOUS
  Filled 2013-01-14 (×3): qty 40

## 2013-01-14 MED ORDER — KCL IN DEXTROSE-NACL 20-5-0.45 MEQ/L-%-% IV SOLN
INTRAVENOUS | Status: DC
  Administered 2013-01-14 – 2013-01-15 (×2): via INTRAVENOUS
  Filled 2013-01-14 (×5): qty 1000

## 2013-01-14 MED ORDER — PANTOPRAZOLE SODIUM 40 MG PO TBEC
40.0000 mg | DELAYED_RELEASE_TABLET | Freq: Every day | ORAL | Status: DC
  Administered 2013-01-15: 40 mg via ORAL
  Filled 2013-01-14: qty 1

## 2013-01-14 MED ORDER — DONEPEZIL HCL 10 MG PO TABS
10.0000 mg | ORAL_TABLET | Freq: Every day | ORAL | Status: DC
  Administered 2013-01-15 – 2013-01-17 (×3): 10 mg via ORAL
  Filled 2013-01-14 (×4): qty 1

## 2013-01-14 NOTE — ED Notes (Signed)
Dr Tsuei here to see patient.   

## 2013-01-14 NOTE — Consult Note (Addendum)
Reason for Consult: Traumatic subarachnoid hemorrhage Referring Physician: Dr. Earlean Shawl Casey Moore is an 79 y.o. male.  HPI: Patient is a 79 year old individual who is institutionalized secondary to Alzheimer's disease.  Had a fall sustaining a facial laceration. A CT scan of the brain demonstrates a right frontal convexity subarachnoid hemorrhage with some hemorrhage in the region of the fall on the right side. There is a substantial amount of atrophy with a large subarachnoid spaces.  Past Medical History  Diagnosis Date  . Osteoarthritis   . Hypertension   . Hx of adenomatous colonic polyps   . Diverticulosis   . Prostate cancer   . Paget's disease   . Kidney stone   . Sinusitis   . SDAT (senile dementia of Alzheimer's type)     Past Surgical History  Procedure Laterality Date  . Cataract extraction      left-with implant  . Total knee arthroplasty      bilateral  . Mastoid surgery      x 5  . Tonsillectomy and adenoidectomy    . Appendectomy    . Knee surgery      bilateral  . Prostatectomy    . Skin cancer excision      right hip    Family History  Problem Relation Age of Onset  . Colon cancer Neg Hx   . Prostate cancer Father   . Dementia Mother   . Throat cancer Paternal Grandfather   . Prostate cancer Brother     Social History:  reports that he has never smoked. He has never used smokeless tobacco. He reports that he drinks alcohol. He reports that he does not use illicit drugs.  Allergies: No Known Allergies  Medications: Have not reviewed the patient's medications  No results found for this or any previous visit (from the past 48 hour(s)).  Ct Head Wo Contrast  01/14/2013   CLINICAL DATA:  Recent traumatic injury  EXAM: CT HEAD WITHOUT CONTRAST  CT MAXILLOFACIAL WITHOUT CONTRAST  TECHNIQUE: Multidetector CT imaging of the head and maxillofacial structures were performed using the standard protocol without intravenous contrast. Multiplanar CT  image reconstructions of the maxillofacial structures were also generated.  COMPARISON:  11/02/2011  FINDINGS: CT HEAD FINDINGS  The bony calvarium is intact. Mild right frontal soft tissue hematoma is noted. Diffuse atrophic changes and chronic white matter ischemic change are identified. There are multiple foci of increased attenuation identified bilaterally in the subarachnoid space consistent with hemorrhage both related to the recent injury. Some contusion is also noted in the left frontal lobe near the vertex as well as the right parietal lobe near the vertex. No intraventricular hemorrhage is seen. No other significant parenchymal abnormality is noted.  CT MAXILLOFACIAL FINDINGS  Nasal bone fractures are noted bilaterally with some local impaction. The nasal septum appears within normal limits. The bony structures of the face are otherwise within normal limits. The orbits and their contents are within normal limits. No blowout fracture is seen. An air-fluid level is noted within the left maxillary antrum likely related to the recent injury. No other focal abnormality is seen.  IMPRESSION: CT head shows multiple foci of hemorrhage consistent with both contusion and mild subarachnoid hemorrhage related to the recent injury.  Chronic changes are seen.  Bilateral nasal bone fractures.  No other focal abnormality is seen.   Electronically Signed   By: Alcide Clever M.D.   On: 01/14/2013 10:01   Ct Maxillofacial Wo Cm  01/14/2013  CLINICAL DATA:  Recent traumatic injury  EXAM: CT HEAD WITHOUT CONTRAST  CT MAXILLOFACIAL WITHOUT CONTRAST  TECHNIQUE: Multidetector CT imaging of the head and maxillofacial structures were performed using the standard protocol without intravenous contrast. Multiplanar CT image reconstructions of the maxillofacial structures were also generated.  COMPARISON:  11/02/2011  FINDINGS: CT HEAD FINDINGS  The bony calvarium is intact. Mild right frontal soft tissue hematoma is noted. Diffuse  atrophic changes and chronic white matter ischemic change are identified. There are multiple foci of increased attenuation identified bilaterally in the subarachnoid space consistent with hemorrhage both related to the recent injury. Some contusion is also noted in the left frontal lobe near the vertex as well as the right parietal lobe near the vertex. No intraventricular hemorrhage is seen. No other significant parenchymal abnormality is noted.  CT MAXILLOFACIAL FINDINGS  Nasal bone fractures are noted bilaterally with some local impaction. The nasal septum appears within normal limits. The bony structures of the face are otherwise within normal limits. The orbits and their contents are within normal limits. No blowout fracture is seen. An air-fluid level is noted within the left maxillary antrum likely related to the recent injury. No other focal abnormality is seen.  IMPRESSION: CT head shows multiple foci of hemorrhage consistent with both contusion and mild subarachnoid hemorrhage related to the recent injury.  Chronic changes are seen.  Bilateral nasal bone fractures.  No other focal abnormality is seen.   Electronically Signed   By: Alcide Clever M.D.   On: 01/14/2013 10:01    Review of Systems  Unable to perform ROS: mental acuity   Blood pressure 142/63, pulse 65, temperature 98 F (36.7 C), temperature source Oral, resp. rate 19, SpO2 96.00%. Physical Exam  Neurological:  Moves all 4 extremities. Agitated disoriented. Secondary to Alzheimer's there    Assessment/Plan: Stable neurologic examination in patient with Alzheimer's disease who has a small traumatic subarachnoid hemorrhage. This should self limit. No surgical intervention required.  Review the patient's CT scan of the neck reveals that he has a type II odontoid fracture that is nondisplaced. At this time he is immobilized in a hard cervical collar. Whether patient will be capable of wearing this for an extended period remains to be  determined. I believe that plain flexion-extension films can be performed to determine the level of instability in this fracture. I'll follow along if there is gross motion patient may be a candidate for surgical stabilization of the type II odontoid fracture.  Kelbie Moro J 01/14/2013, 11:30 AM

## 2013-01-14 NOTE — H&P (Addendum)
History   Casey Moore is an 79 y.o. male.   Chief Complaint:  Fall/ facial lacerations Chief Complaint  Patient presents with  . Fall  . Facial Laceration    Fall Associated symptoms include headaches. Pertinent negatives include no abdominal pain, chest pain, coughing, myalgias, nausea, neck pain or vomiting.  This is a 79 yo male who lives at [facility] secondary to severe Alzheimer's dementia who had an unwitnessed fall today in which he apparently landed on his face.  Questionable LOC.  Family states that he is at his baseline mental status.  Facial laceration/ nasal laceration closed by EDP.  Patient is marginally cooperative secondary to his Alzheimer's.  Patient also complaining of sore neck  Past Medical History  Diagnosis Date  . Osteoarthritis   . Hypertension   . Hx of adenomatous colonic polyps   . Diverticulosis   . Prostate cancer   . Paget's disease   . Kidney stone   . Sinusitis   . SDAT (senile dementia of Alzheimer's type)     Past Surgical History  Procedure Laterality Date  . Cataract extraction      left-with implant  . Total knee arthroplasty      bilateral  . Mastoid surgery      x 5  . Tonsillectomy and adenoidectomy    . Appendectomy    . Knee surgery      bilateral  . Prostatectomy    . Skin cancer excision      right hip    Family History  Problem Relation Age of Onset  . Colon cancer Neg Hx   . Prostate cancer Father   . Dementia Mother   . Throat cancer Paternal Grandfather   . Prostate cancer Brother    Social History:  reports that he has never smoked. He has never used smokeless tobacco. He reports that he drinks alcohol. He reports that he does not use illicit drugs.  Allergies  No Known Allergies  Home Medications   Prior to Admission medications   Medication Sig Start Date End Date Taking? Authorizing Provider  allopurinol (ZYLOPRIM) 300 MG tablet Take 300 mg by mouth daily.    Yes Historical  Provider, MD  Ascorbic Acid (VITAMIN C PO) Take 1 tablet by mouth daily.   Yes Historical Provider, MD  atorvastatin (LIPITOR) 20 MG tablet Take 20 mg by mouth daily.    Yes Historical Provider, MD  b complex vitamins tablet Take 1 tablet by mouth daily.   Yes Historical Provider, MD  Calcium-Magnesium-Vitamin D (CALCIUM 500 PO) Take 750 mg by mouth daily.   Yes Historical Provider, MD  cholecalciferol (VITAMIN D) 1000 UNITS tablet Take 1,000 Units by mouth daily.   Yes Historical Provider, MD  donepezil (ARICEPT) 10 MG tablet Take 10 mg by mouth daily.   Yes Historical Provider, MD  lisinopril (PRINIVIL,ZESTRIL) 40 MG tablet Take 40 mg by mouth daily.    Yes Historical Provider, MD  Multiple Vitamin (MULTIVITAMIN) tablet Take 1 tablet by mouth daily.   Yes Historical Provider, MD  Omega-3 Fatty Acids (SEA-OMEGA 30) 1200 MG CAPS Take 1 capsule by mouth daily.   Yes Historical Provider, MD  vitamin E 400 UNIT capsule Take 800 Units by mouth daily.   Yes Historical Provider, MD    Trauma Course  No results found for this or any previous visit (from the past 48 hour(s)). Ct Head Wo Contrast  01/14/2013   CLINICAL DATA:  Recent  traumatic injury  EXAM: CT HEAD WITHOUT CONTRAST  CT MAXILLOFACIAL WITHOUT CONTRAST  TECHNIQUE: Multidetector CT imaging of the head and maxillofacial structures were performed using the standard protocol without intravenous contrast. Multiplanar CT image reconstructions of the maxillofacial structures were also generated.  COMPARISON:  11/02/2011  FINDINGS: CT HEAD FINDINGS  The bony calvarium is intact. Mild right frontal soft tissue hematoma is noted. Diffuse atrophic changes and chronic white matter ischemic change are identified. There are multiple foci of increased attenuation identified bilaterally in the subarachnoid space consistent with hemorrhage both related to the recent injury. Some contusion is also noted in the left frontal lobe near the vertex as well as the  right parietal lobe near the vertex. No intraventricular hemorrhage is seen. No other significant parenchymal abnormality is noted.  CT MAXILLOFACIAL FINDINGS  Nasal bone fractures are noted bilaterally with some local impaction. The nasal septum appears within normal limits. The bony structures of the face are otherwise within normal limits. The orbits and their contents are within normal limits. No blowout fracture is seen. An air-fluid level is noted within the left maxillary antrum likely related to the recent injury. No other focal abnormality is seen.  IMPRESSION: CT head shows multiple foci of hemorrhage consistent with both contusion and mild subarachnoid hemorrhage related to the recent injury.  Chronic changes are seen.  Bilateral nasal bone fractures.  No other focal abnormality is seen.   Electronically Signed   By: Alcide Clever M.D.   On: 01/14/2013 10:01   Ct Maxillofacial Wo Cm  01/14/2013   CLINICAL DATA:  Recent traumatic injury  EXAM: CT HEAD WITHOUT CONTRAST  CT MAXILLOFACIAL WITHOUT CONTRAST  TECHNIQUE: Multidetector CT imaging of the head and maxillofacial structures were performed using the standard protocol without intravenous contrast. Multiplanar CT image reconstructions of the maxillofacial structures were also generated.  COMPARISON:  11/02/2011  FINDINGS: CT HEAD FINDINGS  The bony calvarium is intact. Mild right frontal soft tissue hematoma is noted. Diffuse atrophic changes and chronic white matter ischemic change are identified. There are multiple foci of increased attenuation identified bilaterally in the subarachnoid space consistent with hemorrhage both related to the recent injury. Some contusion is also noted in the left frontal lobe near the vertex as well as the right parietal lobe near the vertex. No intraventricular hemorrhage is seen. No other significant parenchymal abnormality is noted.  CT MAXILLOFACIAL FINDINGS  Nasal bone fractures are noted bilaterally with some  local impaction. The nasal septum appears within normal limits. The bony structures of the face are otherwise within normal limits. The orbits and their contents are within normal limits. No blowout fracture is seen. An air-fluid level is noted within the left maxillary antrum likely related to the recent injury. No other focal abnormality is seen.  IMPRESSION: CT head shows multiple foci of hemorrhage consistent with both contusion and mild subarachnoid hemorrhage related to the recent injury.  Chronic changes are seen.  Bilateral nasal bone fractures.  No other focal abnormality is seen.   Electronically Signed   By: Alcide Clever M.D.   On: 01/14/2013 10:01    Review of Systems  Constitutional: Negative for weight loss.  HENT: Negative for ear discharge, ear pain, hearing loss and tinnitus.   Eyes: Negative for blurred vision, double vision, photophobia and pain.  Respiratory: Negative for cough, sputum production and shortness of breath.   Cardiovascular: Negative for chest pain.  Gastrointestinal: Negative for nausea, vomiting and abdominal pain.  Genitourinary: Negative  for dysuria, urgency, frequency and flank pain.  Musculoskeletal: Negative for back pain, falls, joint pain, myalgias and neck pain.  Neurological: Positive for headaches. Negative for dizziness, tingling, sensory change, focal weakness and loss of consciousness.  Endo/Heme/Allergies: Does not bruise/bleed easily.  Psychiatric/Behavioral: Negative for depression, memory loss and substance abuse. The patient is not nervous/anxious.     Blood pressure 142/63, pulse 65, temperature 98 F (36.7 C), temperature source Oral, resp. rate 19, SpO2 96.00%. Physical Exam  Constitutional: He appears well-developed and well-nourished.  HENT:  Head:    Edema over bridge of nose   Eyes: EOM are normal. Pupils are equal, round, and reactive to light.  Neck: Neck supple.  Reports some neck soreness - family states that he always has  some stiffness   Cardiovascular: Normal rate and regular rhythm.   Respiratory: Effort normal and breath sounds normal.  GI: Soft. Bowel sounds are normal.  Musculoskeletal: Normal range of motion.  Neurological: He is alert.  Responds to family member, but otherwise disoriented - at baseline      Assessment/Plan 1.  Fall 2.  Alzheimer's dementia 3.  Facial lacerations 4.  Bilateral nasal fractures 5.  Small scattered subarachnoid hemorrhage/ contusions 6.  Cervical tenderness  Consult to Neurosurgery Danielle Dess) and ENT Emeline Darling) - probably no surgical indications at this time Check CT cervical spine Repeat head CT in AM. Admit to step-down unit.   Vallory Oetken K. 01/14/2013, 10:50 AM   Addendum:  CT c-spine shows odontoid fracture.  Patient placed in [place] J collar.  Dr. Danielle Dess notified by EDP.  Wilmon Arms. Corliss Skains, MD, [facility]  General/ Trauma Surgery  01/14/2013 12:10 PM  Procedures

## 2013-01-14 NOTE — ED Provider Notes (Signed)
CSN: [id number]     Arrival date & time 01/14/13  2956 History   First MD Initiated Contact with Patient 01/14/13 [phone]     Chief Complaint  Patient presents with  . Fall  . Facial Laceration    HPI  Patient presents from a nursing facility after a fall. She is advanced dementia, this is a level V caveat. Per report the patient fell from standing height, hitting his face against the floor. No report of loss of consciousness. Per report the patient is at his baseline terms of interactivity. At baseline the patient is verbally aggressive, physically aggressive frequently.    Past Medical History  Diagnosis Date  . Osteoarthritis   . Hypertension   . Hx of adenomatous colonic polyps   . Diverticulosis   . Prostate cancer   . Paget's disease   . Kidney stone   . Sinusitis   . SDAT (senile dementia of Alzheimer's type)    Past Surgical History  Procedure Laterality Date  . Cataract extraction      left-with implant  . Total knee arthroplasty      bilateral  . Mastoid surgery      x 5  . Tonsillectomy and adenoidectomy    . Appendectomy    . Knee surgery      bilateral  . Prostatectomy    . Skin cancer excision      right hip   Family History  Problem Relation Age of Onset  . Colon cancer Neg Hx   . Prostate cancer Father   . Dementia Mother   . Throat cancer Paternal Grandfather   . Prostate cancer Brother    History  Substance Use Topics  . Smoking status: Never Smoker   . Smokeless tobacco: Never Used  . Alcohol Use: Yes     Comment: socially    Review of Systems  Unable to perform ROS: Dementia    Allergies  Review of patient's allergies indicates no known allergies.  Home Medications   Current Outpatient Rx  Name  Route  Sig  Dispense  Refill  . allopurinol (ZYLOPRIM) 300 MG tablet   Oral   Take 300 mg by mouth daily.          . Ascorbic Acid (VITAMIN C PO)   Oral   Take 1 tablet by mouth daily.         Marland Kitchen atorvastatin (LIPITOR) 20  MG tablet   Oral   Take 20 mg by mouth daily.          Marland Kitchen b complex vitamins tablet   Oral   Take 1 tablet by mouth daily.         . Calcium-Magnesium-Vitamin D (CALCIUM 500 PO)   Oral   Take 750 mg by mouth daily.         . cholecalciferol (VITAMIN D) 1000 UNITS tablet   Oral   Take 1,000 Units by mouth daily.         Marland Kitchen donepezil (ARICEPT) 10 MG tablet   Oral   Take 10 mg by mouth daily.         Marland Kitchen lisinopril (PRINIVIL,ZESTRIL) 40 MG tablet   Oral   Take 40 mg by mouth daily.          . Multiple Vitamin (MULTIVITAMIN) tablet   Oral   Take 1 tablet by mouth daily.         . Omega-3 Fatty Acids (SEA-OMEGA 30) 1200 MG CAPS   Oral  Take 1 capsule by mouth daily.         . vitamin E 400 UNIT capsule   Oral   Take 800 Units by mouth daily.          BP 113/61  Pulse 68  Temp(Src) 98 F (36.7 C) (Oral)  Resp 16  SpO2 100% Physical Exam  Nursing note and vitals reviewed. Constitutional: He is oriented to person, place, and time. He appears well-developed. He appears distressed.  HENT:  Head: Normocephalic and atraumatic.    Nose:    Eyes: Conjunctivae and EOM are normal.  Neck:  The patient was intolerant of the cervical collar.  He has hesitancy for range of motion in both directions.  Cardiovascular: Normal rate and regular rhythm.   Pulmonary/Chest: Effort normal. No stridor. No respiratory distress.  Abdominal: He exhibits no distension.  Musculoskeletal: He exhibits no edema.  Neurological: He is alert and oriented to person, place, and time. He has normal strength. No cranial nerve deficit or sensory deficit.  Patient is awake, alert, disoriented.  He does move all extremities spontaneously, responds to commands inconsistently.  Skin: Skin is warm. He is diaphoretic.  Psychiatric: His mood appears anxious. He is aggressive, hyperactive and combative. Cognition and memory are impaired. He expresses impulsivity. He is inattentive.    ED  Course  Procedures (including critical care time) Labs Review Labs Reviewed  CBC - Abnormal; Notable for the following:    RBC 3.18 (*)    Hemoglobin 10.2 (*)    HCT 30.5 (*)    All other components within normal limits  PROTIME-INR  COMPREHENSIVE METABOLIC PANEL   Imaging Review Ct Head Wo Contrast  01/14/2013   ADDENDUM REPORT: 01/14/2013 11:47  ADDENDUM: Further evaluation of sagittal images reveals a mildly displaced fracture through the base of the odontoid. This courses from the anterior cortex posteriorly and inferiorly. There does not appear to be significant displacement of the fracture fragment.   Electronically Signed   By: Alcide Clever M.D.   On: 01/14/2013 11:47   01/14/2013   CLINICAL DATA:  Recent traumatic injury  EXAM: CT HEAD WITHOUT CONTRAST  CT MAXILLOFACIAL WITHOUT CONTRAST  TECHNIQUE: Multidetector CT imaging of the head and maxillofacial structures were performed using the standard protocol without intravenous contrast. Multiplanar CT image reconstructions of the maxillofacial structures were also generated.  COMPARISON:  11/02/2011  FINDINGS: CT HEAD FINDINGS  The bony calvarium is intact. Mild right frontal soft tissue hematoma is noted. Diffuse atrophic changes and chronic white matter ischemic change are identified. There are multiple foci of increased attenuation identified bilaterally in the subarachnoid space consistent with hemorrhage both related to the recent injury. Some contusion is also noted in the left frontal lobe near the vertex as well as the right parietal lobe near the vertex. No intraventricular hemorrhage is seen. No other significant parenchymal abnormality is noted.  CT MAXILLOFACIAL FINDINGS  Nasal bone fractures are noted bilaterally with some local impaction. The nasal septum appears within normal limits. The bony structures of the face are otherwise within normal limits. The orbits and their contents are within normal limits. No blowout fracture is  seen. An air-fluid level is noted within the left maxillary antrum likely related to the recent injury. No other focal abnormality is seen.  IMPRESSION: CT head shows multiple foci of hemorrhage consistent with both contusion and mild subarachnoid hemorrhage related to the recent injury.  Chronic changes are seen.  Bilateral nasal bone fractures.  No other  focal abnormality is seen.  Electronically Signed: By: Alcide Clever M.D. On: 01/14/2013 10:01   Ct Cervical Spine Wo Contrast  01/14/2013   CLINICAL DATA:  Unwitnessed fall, subarachnoid hemorrhage  EXAM: CT CERVICAL SPINE WITHOUT CONTRAST  TECHNIQUE: Multidetector CT imaging of the cervical spine was performed without intravenous contrast. Multiplanar CT image reconstructions were also generated.  COMPARISON:  None.  FINDINGS: Reversal of the normal cervical lordosis.  Type 2 odontoid fracture (series 7/ image 9; series 8/ image 24). Posterior tilting of the dens with approximately 2 mm posterior displacement of C1 on C2 (series 8/ image 23). Spinal canal remains widely patent at this level.  Moderate multilevel degenerative changes of the cervical spine. 3 mm anterolisthesis of C3 on C4.  Visualized thyroid is heterogeneous.  Visualized lung apices are grossly clear.  IMPRESSION: Type 2 odontoid fracture.  Minimal posterior displacement of C1 on C2, as described above.  Critical value/emergent results were called by telephone at the time of interpretation on 01/14/2013 at 11:52 AM to Dr. Gerhard Munch , who verbally acknowledged these results.   Electronically Signed   By: Charline Bills M.D.   On: 01/14/2013 11:53   Ct Maxillofacial Wo Cm  01/14/2013   ADDENDUM REPORT: 01/14/2013 11:47  ADDENDUM: Further evaluation of sagittal images reveals a mildly displaced fracture through the base of the odontoid. This courses from the anterior cortex posteriorly and inferiorly. There does not appear to be significant displacement of the fracture fragment.    Electronically Signed   By: Alcide Clever M.D.   On: 01/14/2013 11:47   01/14/2013   CLINICAL DATA:  Recent traumatic injury  EXAM: CT HEAD WITHOUT CONTRAST  CT MAXILLOFACIAL WITHOUT CONTRAST  TECHNIQUE: Multidetector CT imaging of the head and maxillofacial structures were performed using the standard protocol without intravenous contrast. Multiplanar CT image reconstructions of the maxillofacial structures were also generated.  COMPARISON:  11/02/2011  FINDINGS: CT HEAD FINDINGS  The bony calvarium is intact. Mild right frontal soft tissue hematoma is noted. Diffuse atrophic changes and chronic white matter ischemic change are identified. There are multiple foci of increased attenuation identified bilaterally in the subarachnoid space consistent with hemorrhage both related to the recent injury. Some contusion is also noted in the left frontal lobe near the vertex as well as the right parietal lobe near the vertex. No intraventricular hemorrhage is seen. No other significant parenchymal abnormality is noted.  CT MAXILLOFACIAL FINDINGS  Nasal bone fractures are noted bilaterally with some local impaction. The nasal septum appears within normal limits. The bony structures of the face are otherwise within normal limits. The orbits and their contents are within normal limits. No blowout fracture is seen. An air-fluid level is noted within the left maxillary antrum likely related to the recent injury. No other focal abnormality is seen.  IMPRESSION: CT head shows multiple foci of hemorrhage consistent with both contusion and mild subarachnoid hemorrhage related to the recent injury.  Chronic changes are seen.  Bilateral nasal bone fractures.  No other focal abnormality is seen.  Electronically Signed: By: Alcide Clever M.D. On: 01/14/2013 10:01    EKG Interpretation   None      After the initial exam, with concern for intracranial injury, and subsequently with concern for neck injury the patient had CT scans  performed.  LACERATION REPAIR Performed by: Gerhard Munch Authorized by: Gerhard Munch Consent: Verbal consent obtained. Risks and benefits: risks, benefits and alternatives were discussed Consent given by: patient Patient  identity confirmed: provided demographic data Prepped and Draped in normal sterile fashion Wound explored  Laceration Location: forehead and nose  Laceration Length: 3 (nose)+ 6 (stellate forehead lac) cm  No Foreign Bodies seen or palpated  Anesthesia: local infiltration  Local anesthetic: lidocaine 1% w/o epinephrine  Anesthetic total: 4 ml  Irrigation method: syringe Amount of cleaning: standard  Skin closure: 6-0 on forehead 5sutures,  Adhesive on nose  Number of sutures: 5  Technique: rough - given the stellate nature of the wounds  Patient tolerance: Patient tolerated the procedure well with no immediate complications.    I discussed the patient's case with our radiologist.  There is a type II dens fracture. On repeat exam the patient continues to move ultimately spontaneously, has no new complaints.  12:01 PM Aspen collar applied MDM   1. Traumatic subarachnoid hemorrhage, initial encounter   2. Facial fracture due to fall, closed, initial encounter   3. Facial laceration, initial encounter    This patient with dementia presents after a mechanical fall.  The patient's inability to relate the history of present illness, or consistently interacting limits the history, and the initial exam.  Color, with concern for traumatic injuries the patient had radiographic studies performed.  These demonstrated intracranial hemorrhage, acute fracture of the cervical spine, nasal bone fracture. Patient had suture repair, adhesive repair which was well tolerated.  Given the nature of his injuries he was admitted to the trauma surgery team, after I discussed the case with both lower ENT and neurosurgery colleagues.  CRITICAL CARE Performed by:  Gerhard Munch Total critical care time: 50 Critical care time was exclusive of separately billable procedures and treating other patients. Critical care was necessary to treat or prevent imminent or life-threatening deterioration. Critical care was time spent personally by me on the following activities: development of treatment plan with patient and/or surrogate as well as nursing, discussions with consultants, evaluation of patient's response to treatment, examination of patient, obtaining history from patient or surrogate, ordering and performing treatments and interventions, ordering and review of laboratory studies, ordering and review of radiographic studies, pulse oximetry and re-evaluation of patient's condition.     Gerhard Munch, MD 01/14/13 1229

## 2013-01-14 NOTE — ED Notes (Signed)
Dr. Jeraldine Loots at the bedside to suture

## 2013-01-14 NOTE — Consult Note (Signed)
PMH:  Past Medical History  Diagnosis Date  . Osteoarthritis   . Hypertension   . Hx of adenomatous colonic polyps   . Diverticulosis   . Prostate cancer   . Paget's disease   . Kidney stone   . Sinusitis   . SDAT (senile dementia of Alzheimer's type)     PSH:  Past Surgical History  Procedure Laterality Date  . Cataract extraction      left-with implant  . Total knee arthroplasty      bilateral  . Mastoid surgery      x 5  . Tonsillectomy and adenoidectomy    . Appendectomy    . Knee surgery      bilateral  . Prostatectomy    . Skin cancer excision      right hip      Amardeep, Beckers [id number] [birth year] Trauma Md, MD  Reason for Consult: Epistaxis  HPI: alzheimer's patient who fell today, CT showed bilateral minimally displace nasal bone/nasal septal fractures with no/minimal cosmetic deformity. ENT consulted for nasal fracture  Allergies: No Known Allergies  ROS: headache, intermittent confusion, facial pain and swelling, otherwise negative x 12 systems except per HPI  PMH:  Past Medical History  Diagnosis Date  . Osteoarthritis   . Hypertension   . Hx of adenomatous colonic polyps   . Diverticulosis   . Prostate cancer   . Paget's disease   . Kidney stone   . Sinusitis   . SDAT (senile dementia of Alzheimer's type)     FH:  Family History  Problem Relation Age of Onset  . Colon cancer Neg Hx   . Prostate cancer Father   . Dementia Mother   . Throat cancer Paternal Grandfather   . Prostate cancer Brother     SH:  History   Social History  . Marital Status: Married    Spouse Name: N/A    Number of Children: 3  . Years of Education: N/A   Occupational History  . Not on file.   Social History Main Topics  . Smoking status: Never Smoker   . Smokeless tobacco: Never Used  . Alcohol Use: Yes     Comment: socially  . Drug Use: No  . Sexual Activity: Not on file   Other Topics Concern  . Not on file   Social History  Narrative  . No narrative on file    PSH:  Past Surgical History  Procedure Laterality Date  . Cataract extraction      left-with implant  . Total knee arthroplasty      bilateral  . Mastoid surgery      x 5  . Tonsillectomy and adenoidectomy    . Appendectomy    . Knee surgery      bilateral  . Prostatectomy    . Skin cancer excision      right hip    Physical  Exam: CN 2-12 grossly intact and symmetric. EAC/TMs normal BL. Oral cavity, lips, gums, ororpharynx normal with no masses or lesions. Class I occlusion.  Skin warm and dry. Nasal cavity with no polyps or purulence, essentially midline septum with no hematoma and minimal abrasions, normal turbinates. External nose midline with some minimal/mild swelling from the trauma but no stepoffs and nasal dorsum is midline and stable to palpation with no significant cosmetic deformity. EOMI, PERRLA. Neck supple with midline trachea.  A/P: minimally displaced nasal bone/nasal septal fractures with no significant cosmetic deformity. Can follow up  as needed, no need for surgical intervention at this time.   Melvenia Beam 01/14/2013 11:50 AM

## 2013-01-14 NOTE — ED Notes (Signed)
Per [facility], pt from [facility] for fall this unwitnessed fall this morning. Pt hollers all the time. Was apparently up walking earlier this morning prior to shift change. C/o lower back pain and bilateral leg pain. Has a laceration to mid forehead and top bridge of nose. Per EMS, pt stood up okay.

## 2013-01-15 ENCOUNTER — Inpatient Hospital Stay (HOSPITAL_COMMUNITY): Payer: 59

## 2013-01-15 DIAGNOSIS — F0390 Unspecified dementia without behavioral disturbance: Secondary | ICD-10-CM

## 2013-01-15 DIAGNOSIS — S022XXA Fracture of nasal bones, initial encounter for closed fracture: Secondary | ICD-10-CM

## 2013-01-15 DIAGNOSIS — K5790 Diverticulosis of intestine, part unspecified, without perforation or abscess without bleeding: Secondary | ICD-10-CM | POA: Insufficient documentation

## 2013-01-15 DIAGNOSIS — S129XXA Fracture of neck, unspecified, initial encounter: Secondary | ICD-10-CM

## 2013-01-15 DIAGNOSIS — I10 Essential (primary) hypertension: Secondary | ICD-10-CM | POA: Insufficient documentation

## 2013-01-15 DIAGNOSIS — F0281 Dementia in other diseases classified elsewhere with behavioral disturbance: Secondary | ICD-10-CM | POA: Diagnosis present

## 2013-01-15 DIAGNOSIS — S0181XA Laceration without foreign body of other part of head, initial encounter: Secondary | ICD-10-CM

## 2013-01-15 DIAGNOSIS — S12100A Unspecified displaced fracture of second cervical vertebra, initial encounter for closed fracture: Secondary | ICD-10-CM

## 2013-01-15 DIAGNOSIS — M1990 Unspecified osteoarthritis, unspecified site: Secondary | ICD-10-CM | POA: Insufficient documentation

## 2013-01-15 DIAGNOSIS — Z9181 History of falling: Secondary | ICD-10-CM

## 2013-01-15 LAB — CBC
HCT: 26.6 % — ABNORMAL LOW (ref 39.0–52.0)
Hemoglobin: 9.1 g/dL — ABNORMAL LOW (ref 13.0–17.0)
MCH: 32.4 pg (ref 26.0–34.0)
MCV: 94.7 fL (ref 78.0–100.0)
RBC: 2.81 MIL/uL — ABNORMAL LOW (ref 4.22–5.81)
RDW: 14.6 % (ref 11.5–15.5)
WBC: 5.9 10*3/uL (ref 4.0–10.5)

## 2013-01-15 LAB — BASIC METABOLIC PANEL
BUN: 21 mg/dL (ref 6–23)
CO2: 25 mEq/L (ref 19–32)
Chloride: 105 mEq/L (ref 96–112)
Creatinine, Ser: 1.41 mg/dL — ABNORMAL HIGH (ref 0.50–1.35)
GFR calc Af Amer: 53 mL/min — ABNORMAL LOW (ref >90)
Glucose, Bld: 107 mg/dL — ABNORMAL HIGH (ref 70–99)
Potassium: 4.9 mEq/L (ref 3.5–5.1)

## 2013-01-15 MED ORDER — HALOPERIDOL LACTATE 5 MG/ML IJ SOLN
2.0000 mg | Freq: Once | INTRAMUSCULAR | Status: AC
  Administered 2013-01-15: 2 mg via INTRAVENOUS
  Filled 2013-01-15: qty 1

## 2013-01-15 NOTE — Progress Notes (Signed)
Subjective: Patient reports Patient denies any significant. Motor function appears intact. Denies any pain in neck.  Objective: Vital signs in last 24 hours: Temp:  [97.8 F (36.6 C)-99.1 F (37.3 C)] 98.7 F (37.1 C) (12/15 1600) Pulse Rate:  [51-65] 56 (12/15 1700) Resp:  [13-22] 16 (12/15 1700) BP: (98-120)/(56-77) 109/62 mmHg (12/15 1700) SpO2:  [90 %-100 %] 98 % (12/15 1700)  Intake/Output from previous day: 12/14 0701 - 12/15 0700 In: 1821.3 [P.O.:640; I.V.:1181.3] Out: -  Intake/Output this shift: Total I/O In: 1470 [P.O.:720; I.V.:750] Out: 300 [Urine:300]  Motor function appears intact in upper and lower extremities.  Lab Results:  Recent Labs  01/14/13 1117 01/15/13 0405  WBC 9.4 5.9  HGB 10.2* 9.1*  HCT 30.5* 26.6*  PLT 193 159   BMET  Recent Labs  01/14/13 1117 01/15/13 0405  NA 142 138  K 4.2 4.9  CL 107 105  CO2 24 25  GLUCOSE 91 107*  BUN 27* 21  CREATININE 1.51* 1.41*  CALCIUM 9.0 9.2    Studies/Results: Ct Head Wo Contrast  01/14/2013   ADDENDUM REPORT: 01/14/2013 11:47  ADDENDUM: Further evaluation of sagittal images reveals a mildly displaced fracture through the base of the odontoid. This courses from the anterior cortex posteriorly and inferiorly. There does not appear to be significant displacement of the fracture fragment.   Electronically Signed   By: Alcide Clever M.D.   On: 01/14/2013 11:47   01/14/2013   CLINICAL DATA:  Recent traumatic injury  EXAM: CT HEAD WITHOUT CONTRAST  CT MAXILLOFACIAL WITHOUT CONTRAST  TECHNIQUE: Multidetector CT imaging of the head and maxillofacial structures were performed using the standard protocol without intravenous contrast. Multiplanar CT image reconstructions of the maxillofacial structures were also generated.  COMPARISON:  11/02/2011  FINDINGS: CT HEAD FINDINGS  The bony calvarium is intact. Mild right frontal soft tissue hematoma is noted. Diffuse atrophic changes and chronic white matter ischemic  change are identified. There are multiple foci of increased attenuation identified bilaterally in the subarachnoid space consistent with hemorrhage both related to the recent injury. Some contusion is also noted in the left frontal lobe near the vertex as well as the right parietal lobe near the vertex. No intraventricular hemorrhage is seen. No other significant parenchymal abnormality is noted.  CT MAXILLOFACIAL FINDINGS  Nasal bone fractures are noted bilaterally with some local impaction. The nasal septum appears within normal limits. The bony structures of the face are otherwise within normal limits. The orbits and their contents are within normal limits. No blowout fracture is seen. An air-fluid level is noted within the left maxillary antrum likely related to the recent injury. No other focal abnormality is seen.  IMPRESSION: CT head shows multiple foci of hemorrhage consistent with both contusion and mild subarachnoid hemorrhage related to the recent injury.  Chronic changes are seen.  Bilateral nasal bone fractures.  No other focal abnormality is seen.  Electronically Signed: By: Alcide Clever M.D. On: 01/14/2013 10:01   Ct Cervical Spine Wo Contrast  01/14/2013   CLINICAL DATA:  Unwitnessed fall, subarachnoid hemorrhage  EXAM: CT CERVICAL SPINE WITHOUT CONTRAST  TECHNIQUE: Multidetector CT imaging of the cervical spine was performed without intravenous contrast. Multiplanar CT image reconstructions were also generated.  COMPARISON:  None.  FINDINGS: Reversal of the normal cervical lordosis.  Type 2 odontoid fracture (series 7/ image 9; series 8/ image 24). Posterior tilting of the dens with approximately 2 mm posterior displacement of C1 on C2 (series 8/ image  23). Spinal canal remains widely patent at this level.  Moderate multilevel degenerative changes of the cervical spine. 3 mm anterolisthesis of C3 on C4.  Visualized thyroid is heterogeneous.  Visualized lung apices are grossly clear.  IMPRESSION:  Type 2 odontoid fracture.  Minimal posterior displacement of C1 on C2, as described above.  Critical value/emergent results were called by telephone at the time of interpretation on 01/14/2013 at 11:52 AM to Dr. Gerhard Munch , who verbally acknowledged these results.   Electronically Signed   By: Charline Bills M.D.   On: 01/14/2013 11:53   Ct Maxillofacial Wo Cm  01/14/2013   ADDENDUM REPORT: 01/14/2013 11:47  ADDENDUM: Further evaluation of sagittal images reveals a mildly displaced fracture through the base of the odontoid. This courses from the anterior cortex posteriorly and inferiorly. There does not appear to be significant displacement of the fracture fragment.   Electronically Signed   By: Alcide Clever M.D.   On: 01/14/2013 11:47   01/14/2013   CLINICAL DATA:  Recent traumatic injury  EXAM: CT HEAD WITHOUT CONTRAST  CT MAXILLOFACIAL WITHOUT CONTRAST  TECHNIQUE: Multidetector CT imaging of the head and maxillofacial structures were performed using the standard protocol without intravenous contrast. Multiplanar CT image reconstructions of the maxillofacial structures were also generated.  COMPARISON:  11/02/2011  FINDINGS: CT HEAD FINDINGS  The bony calvarium is intact. Mild right frontal soft tissue hematoma is noted. Diffuse atrophic changes and chronic white matter ischemic change are identified. There are multiple foci of increased attenuation identified bilaterally in the subarachnoid space consistent with hemorrhage both related to the recent injury. Some contusion is also noted in the left frontal lobe near the vertex as well as the right parietal lobe near the vertex. No intraventricular hemorrhage is seen. No other significant parenchymal abnormality is noted.  CT MAXILLOFACIAL FINDINGS  Nasal bone fractures are noted bilaterally with some local impaction. The nasal septum appears within normal limits. The bony structures of the face are otherwise within normal limits. The orbits and  their contents are within normal limits. No blowout fracture is seen. An air-fluid level is noted within the left maxillary antrum likely related to the recent injury. No other focal abnormality is seen.  IMPRESSION: CT head shows multiple foci of hemorrhage consistent with both contusion and mild subarachnoid hemorrhage related to the recent injury.  Chronic changes are seen.  Bilateral nasal bone fractures.  No other focal abnormality is seen.  Electronically Signed: By: Alcide Clever M.D. On: 01/14/2013 10:01    Assessment/Plan: After review of CT scan question of whether this does not represent a chronic type II odontoid fracture. We'll obtain flexion-extension films in the a.m. Hopefully patient will be a bit more cooperative at the time  LOS: 1 day  Flex X. films of the cervical spine in the a.m.   Jazmarie Biever J 01/15/2013, 5:54 PM

## 2013-01-15 NOTE — Progress Notes (Signed)
Trauma Service Note  Subjective: Patient still disoriented.  Says he is at home.  No idea about what has happened.  Objective: Vital signs in last 24 hours: Temp:  [98 F (36.7 C)-99.1 F (37.3 C)] 98.1 F (36.7 C) (12/15 0338) Pulse Rate:  [60-68] 61 (12/15 0338) Resp:  [14-25] 19 (12/15 0338) BP: (98-142)/(56-95) 98/71 mmHg (12/15 0338) SpO2:  [96 %-100 %] 97 % (12/15 0338) Weight:  [86.6 kg (190 lb 14.7 oz)] 86.6 kg (190 lb 14.7 oz) (12/14 1705)    Intake/Output from previous day: 12/14 0701 - 12/15 0700 In: 1746.3 [P.O.:640; I.V.:1106.3] Out: -  Intake/Output this shift:    General: No distress, restrained.    Lungs: Clear, sats are good.  Abd: Nontender and benign  Extremities: No changes  Neuro: Completely intact.  NS has suggested flexion/extension films for Type II odontoid fracture which I expect they will order.  Lab Results: CBC   Recent Labs  01/14/13 1117 01/15/13 0405  WBC 9.4 5.9  HGB 10.2* 9.1*  HCT 30.5* 26.6*  PLT 193 159   BMET  Recent Labs  01/14/13 1117 01/15/13 0405  NA 142 138  K 4.2 4.9  CL 107 105  CO2 24 25  GLUCOSE 91 107*  BUN 27* 21  CREATININE 1.51* 1.41*  CALCIUM 9.0 9.2   PT/INR  Recent Labs  01/14/13 1117  LABPROT 14.1  INR 1.11   ABG No results found for this basename: PHART, PCO2, PO2, HCO3,  in the last 72 hours  Studies/Results: Ct Head Wo Contrast  01/14/2013   ADDENDUM REPORT: 01/14/2013 11:47  ADDENDUM: Further evaluation of sagittal images reveals a mildly displaced fracture through the base of the odontoid. This courses from the anterior cortex posteriorly and inferiorly. There does not appear to be significant displacement of the fracture fragment.   Electronically Signed   By: Alcide Clever M.D.   On: 01/14/2013 11:47   01/14/2013   CLINICAL DATA:  Recent traumatic injury  EXAM: CT HEAD WITHOUT CONTRAST  CT MAXILLOFACIAL WITHOUT CONTRAST  TECHNIQUE: Multidetector CT imaging of the head and  maxillofacial structures were performed using the standard protocol without intravenous contrast. Multiplanar CT image reconstructions of the maxillofacial structures were also generated.  COMPARISON:  11/02/2011  FINDINGS: CT HEAD FINDINGS  The bony calvarium is intact. Mild right frontal soft tissue hematoma is noted. Diffuse atrophic changes and chronic white matter ischemic change are identified. There are multiple foci of increased attenuation identified bilaterally in the subarachnoid space consistent with hemorrhage both related to the recent injury. Some contusion is also noted in the left frontal lobe near the vertex as well as the right parietal lobe near the vertex. No intraventricular hemorrhage is seen. No other significant parenchymal abnormality is noted.  CT MAXILLOFACIAL FINDINGS  Nasal bone fractures are noted bilaterally with some local impaction. The nasal septum appears within normal limits. The bony structures of the face are otherwise within normal limits. The orbits and their contents are within normal limits. No blowout fracture is seen. An air-fluid level is noted within the left maxillary antrum likely related to the recent injury. No other focal abnormality is seen.  IMPRESSION: CT head shows multiple foci of hemorrhage consistent with both contusion and mild subarachnoid hemorrhage related to the recent injury.  Chronic changes are seen.  Bilateral nasal bone fractures.  No other focal abnormality is seen.  Electronically Signed: By: Alcide Clever M.D. On: 01/14/2013 10:01   Ct Cervical Spine Wo  Contrast  01/14/2013   CLINICAL DATA:  Unwitnessed fall, subarachnoid hemorrhage  EXAM: CT CERVICAL SPINE WITHOUT CONTRAST  TECHNIQUE: Multidetector CT imaging of the cervical spine was performed without intravenous contrast. Multiplanar CT image reconstructions were also generated.  COMPARISON:  None.  FINDINGS: Reversal of the normal cervical lordosis.  Type 2 odontoid fracture (series 7/  image 9; series 8/ image 24). Posterior tilting of the dens with approximately 2 mm posterior displacement of C1 on C2 (series 8/ image 23). Spinal canal remains widely patent at this level.  Moderate multilevel degenerative changes of the cervical spine. 3 mm anterolisthesis of C3 on C4.  Visualized thyroid is heterogeneous.  Visualized lung apices are grossly clear.  IMPRESSION: Type 2 odontoid fracture.  Minimal posterior displacement of C1 on C2, as described above.  Critical value/emergent results were called by telephone at the time of interpretation on 01/14/2013 at 11:52 AM to Dr. Gerhard Munch , who verbally acknowledged these results.   Electronically Signed   By: Charline Bills M.D.   On: 01/14/2013 11:53   Ct Maxillofacial Wo Cm  01/14/2013   ADDENDUM REPORT: 01/14/2013 11:47  ADDENDUM: Further evaluation of sagittal images reveals a mildly displaced fracture through the base of the odontoid. This courses from the anterior cortex posteriorly and inferiorly. There does not appear to be significant displacement of the fracture fragment.   Electronically Signed   By: Alcide Clever M.D.   On: 01/14/2013 11:47   01/14/2013   CLINICAL DATA:  Recent traumatic injury  EXAM: CT HEAD WITHOUT CONTRAST  CT MAXILLOFACIAL WITHOUT CONTRAST  TECHNIQUE: Multidetector CT imaging of the head and maxillofacial structures were performed using the standard protocol without intravenous contrast. Multiplanar CT image reconstructions of the maxillofacial structures were also generated.  COMPARISON:  11/02/2011  FINDINGS: CT HEAD FINDINGS  The bony calvarium is intact. Mild right frontal soft tissue hematoma is noted. Diffuse atrophic changes and chronic white matter ischemic change are identified. There are multiple foci of increased attenuation identified bilaterally in the subarachnoid space consistent with hemorrhage both related to the recent injury. Some contusion is also noted in the left frontal lobe near the  vertex as well as the right parietal lobe near the vertex. No intraventricular hemorrhage is seen. No other significant parenchymal abnormality is noted.  CT MAXILLOFACIAL FINDINGS  Nasal bone fractures are noted bilaterally with some local impaction. The nasal septum appears within normal limits. The bony structures of the face are otherwise within normal limits. The orbits and their contents are within normal limits. No blowout fracture is seen. An air-fluid level is noted within the left maxillary antrum likely related to the recent injury. No other focal abnormality is seen.  IMPRESSION: CT head shows multiple foci of hemorrhage consistent with both contusion and mild subarachnoid hemorrhage related to the recent injury.  Chronic changes are seen.  Bilateral nasal bone fractures.  No other focal abnormality is seen.  Electronically Signed: By: Alcide Clever M.D. On: 01/14/2013 10:01    Anti-infectives: Anti-infectives   None      Assessment/Plan: s/p  Advance diet Keep in SDU Need to speak with family about disposition at some point. Not ready for discharge yet. Dr. Danielle Dess to order flexion/extension C-spine X-rays.  He may need surgery.  LOS: 1 day   Marta Lamas. Gae Bon, MD, FACS [phone] Trauma Surgeon 01/15/2013

## 2013-01-16 ENCOUNTER — Inpatient Hospital Stay (HOSPITAL_COMMUNITY): Payer: 59

## 2013-01-16 DIAGNOSIS — N179 Acute kidney failure, unspecified: Secondary | ICD-10-CM | POA: Diagnosis present

## 2013-01-16 DIAGNOSIS — D62 Acute posthemorrhagic anemia: Secondary | ICD-10-CM

## 2013-01-16 DIAGNOSIS — D649 Anemia, unspecified: Secondary | ICD-10-CM | POA: Diagnosis present

## 2013-01-16 MED ORDER — TRAMADOL HCL 50 MG PO TABS: 50.0000 mg | ORAL_TABLET | Freq: Four times a day (QID) | ORAL | Status: DC | PRN

## 2013-01-16 MED ORDER — MORPHINE SULFATE 2 MG/ML IJ SOLN: 2.0000 mg | INTRAMUSCULAR | Status: DC | PRN

## 2013-01-16 NOTE — Progress Notes (Addendum)
Pt to TX today, VSS, restraint order current. Called report.

## 2013-01-16 NOTE — Clinical Social Work Note (Signed)
Clinical Social Work Department BRIEF PSYCHOSOCIAL ASSESSMENT 01/16/2013  Patient:  Casey Moore, Casey Moore     Account Number:  [id number]     Admit date:  01/14/2013  Clinical Social Worker:  Verl Blalock  Date/Time:  01/16/2013 03:30 PM  Referred by:  Physician  Date Referred:  01/16/2013 Referred for  Other - See comment   Other Referral:   From [facility] - memory care unit   Interview type:  Family Other interview type:   Spoke with patient wife over the phone    PSYCHOSOCIAL DATA Living Status:  FACILITY Admitted from facility:  [facility] Level of care:  Assisted Living Primary support name:  Casey Moore, Casey Moore  [phone] Primary support relationship to patient:  SPOUSE Degree of support available:   Strong    CURRENT CONCERNS Current Concerns  Post-Acute Placement   Other Concerns:    SOCIAL WORK ASSESSMENT / PLAN Clinical Social Worker spoke with patient wife over the phone to offer support and discuss patient needs at discharge.  Patient wife states that patient is a current resident at [facility] and she would prefer that he return at discharge.  CSW spoke with Bradly Bienenstock at [facility] who states that patient return is pending patient chart information.  CSW spoke about PT evaluation - facility states that this is close to patient baseline.  CSW will complete FL2 and send requested documents to facility.  CSW to update patient wife tomorrow regarding patient ability to return to facility.  Patient wife did not provide permission at this time to initiate SNF search as back up plan.    CSW unable to complete SBIRT due to patient altered mental status.  CSW remains available for support and to facilitate patient discharge needs once medically stable.   Assessment/plan status:  Psychosocial Support/Ongoing Assessment of Needs Other assessment/ plan:   Information/referral to community resources:   Clinical Social Worker  offered patient wife SNF resources, however she adamantly states that she would like patient to return to [facility].    PATIENT'S/FAMILY'S RESPONSE TO PLAN OF CARE: Patient with altered mental status at baseline - patient wife able to provide supplemental information.  Patient seems to be at his baseline from the facility and therefore expect patient ability to return once medically stable. Patient wife in [place] and plans to return home tomorrow evening.  Patient wife aware of social work role and verbalized appreciation for support.

## 2013-01-16 NOTE — Evaluation (Signed)
Physical Therapy Evaluation Patient Details Name: JSIAH MENTA MRN: [id number] DOB: [birth year] Today's Date: 01/16/2013 Time: 8469-6295 PT Time Calculation (min): 30 min  PT Assessment / Plan / Recommendation History of Present Illness  79 yo male who lives at [facility] secondary to severe Alzheimer's dementia who had an unwitnessed fall today in which he apparently landed on his face.  Questionable LOC.  Family states that he is at his baseline mental status.  Facial laceration/ nasal laceration closed by EDP.  Patient is marginally cooperative secondary to his Alzheimer's.  Patient also complaining of sore neck  Clinical Impression  Pt with noted alzheimers however was co-operative and pleasant with PT until pt used R UE or attempt mobility pt screaming out "Stop hurting me or I'll kill you." "are you going to kill me know." Pt became instantly agitated every time pt attempted to use R UE - recommend an xray of R UE to RN to rule out possible fracture. When pt asked where it hurts the most pt reports "my R elbow" Pt currently requiring total assist for all mobility however unable to attempt any OOB mobility due to pain. Pt most likely to need SNF upon d/c due to increased level of assist and h/o alzheimers and agitation.    PT Assessment  Patient needs continued PT services    Follow Up Recommendations  SNF;Supervision/Assistance - 24 hour    Does the patient have the potential to tolerate intense rehabilitation      Barriers to Discharge        Equipment Recommendations   (TBD)    Recommendations for Other Services     Frequency Min 4X/week    Precautions / Restrictions Precautions Precautions: Fall;Cervical Required Braces or Orthoses: Cervical Brace Cervical Brace: Hard collar Restrictions Weight Bearing Restrictions: No   Pertinent Vitals/Pain Unable to rate but becomes extremely agitated with R UE mvmt      Mobility  Bed Mobility Bed Mobility:  Supine to Sit;Sit to Supine Supine to Sit: 1: +2 Total assist;HOB elevated Supine to Sit: Patient Percentage: 20% Sit to Supine: 1: +2 Total assist;HOB flat Sit to Supine: Patient Percentage: 20% Details for Bed Mobility Assistance: pt unable to follow commands to roll, HOB elevated and bed pad used to bring pt to EOB, pt reports "stop hurting me or i'll kill you." but once at EOB pt happy to be sitting up Transfers Transfers: Not assessed (attempted sit to stand multiple times however unsuccessful due to pt becoming agitated and resistant anterior mvmt. Pt extremely retropulsion Ambulation/Gait Ambulation/Gait Assistance: Not tested (comment)    Exercises     PT Diagnosis: Difficulty walking;Acute pain  PT Problem List: Decreased strength;Decreased activity tolerance;Decreased balance;Decreased mobility;Decreased cognition PT Treatment Interventions: DME instruction;Gait training;Stair training;Functional mobility training;Therapeutic activities;Therapeutic exercise     PT Goals(Current goals can be found in the care plan section) Acute Rehab PT Goals Patient Stated Goal: unable to report PT Goal Formulation: Patient unable to participate in goal setting Time For Goal Achievement: 01/30/13 Potential to Achieve Goals: Fair  Visit Information  Last PT Received On: 01/16/13 Assistance Needed: +2 History of Present Illness: 79 yo male who lives at [facility] secondary to severe Alzheimer's dementia who had an unwitnessed fall today in which he apparently landed on his face.  Questionable LOC.  Family states that he is at his baseline mental status.  Facial laceration/ nasal laceration closed by EDP.  Patient is marginally cooperative secondary to  his Alzheimer's.  Patient also complaining of sore neck       Prior Functioning  Home Living Family/patient expects to be discharged to::  ([facility] unit) Additional Comments: wife is currently in [place],  pt has only been at [facility] for a month. Wife to come back wednesday night. Prior Function Level of Independence: Needs assistance Gait / Transfers Assistance Needed: per family member pt ambulates without device ADL's / Homemaking Assistance Needed: total assist Communication / Swallowing Assistance Needed: pt easily agitated, moving R UE provokes instant agitation    Cognition  Cognition Arousal/Alertness: Awake/alert Behavior During Therapy: Restless (easily agitated) Overall Cognitive Status: History of cognitive impairments - at baseline    Extremity/Trunk Assessment Upper Extremity Assessment Upper Extremity Assessment: RUE deficits/detail RUE Deficits / Details: pt initiates ROM however then screams in pain and becomes agitated when attempting to use UE Lower Extremity Assessment Lower Extremity Assessment: Difficult to assess due to impaired cognition (pt initiates mvmt) Cervical / Trunk Assessment Cervical / Trunk Assessment:  (hard c-collar)   Balance Balance Balance Assessed: Yes Static Sitting Balance Static Sitting - Balance Support: Feet supported;Bilateral upper extremity supported Static Sitting - Level of Assistance: 1: +1 Total assist Static Sitting - Comment/# of Minutes: pt with strong R lateral lean, pt resistant to help to maintain midline.   End of Session PT - End of Session Equipment Utilized During Treatment: Gait belt Activity Tolerance: Treatment limited secondary to agitation Patient left: in bed;with call bell/phone within reach;with bed alarm set;with family/visitor present Nurse Communication: Mobility status (asked for xray order for R UE.)  GP     Ariann Khaimov Marie 01/16/2013, 2:41 PM   Lewis Shock, PT, DPT Pager #: [phone] Office #: [phone]

## 2013-01-16 NOTE — Progress Notes (Signed)
This patient has been seen and I agree with the findings and treatment plan.  Jadon Harbaugh O. Tinzley Dalia, III, MD, FACS [phone] (pager) [phone] (direct pager) Trauma Surgeon  

## 2013-01-16 NOTE — Progress Notes (Signed)
Patient ID: Casey Moore, male   DOB: Nov 28, 1931, 79 y.o.   MRN: [id number] Flexion-extension films reviewed today. There is no translational motion between flexion and extension. I believe that this patient's fracture at C2 vertebrae is old. Given his poor cooperative effort with wearing a brace I believe that removing the immobilization would be in his best interest. I like to follow him up in approximately 2 weeks' time in the office with a repeat flexion-extension films to see that no translational movement E. false in this patient but I believe that treatment conservatively without collar immobilization is his best option. I do not see a role for surgical stabilization at this time.

## 2013-01-16 NOTE — Progress Notes (Signed)
Patient ID: Casey Moore, male   DOB: 11-Jan-1932, 79 y.o.   MRN: [id number]   LOS: 2 days   Subjective: No c/o.  Objective: Vital signs in last 24 hours: Temp:  [97.3 F (36.3 C)-99.4 F (37.4 C)] 98.1 F (36.7 C) (12/16 0700) Pulse Rate:  [35-66] 66 (12/16 0407) Resp:  [13-22] 17 (12/16 0800) BP: (103-126)/(52-77) 110/61 mmHg (12/16 0800) SpO2:  [95 %-100 %] 96 % (12/16 0407)    Radiology Results CERVICAL SPINE - FLEXION AND EXTENSION VIEWS ONLY  COMPARISON: CT 01/14/2013  FINDINGS:  Study is severely limited my overlying shoulders. Cannot visualize  below the C3 level. I see no definite change in alignment with  limited flexion and extension.  IMPRESSION:  No definite change in the upper cervical spine with limited flexion  and extension.  Electronically Signed  By: Charlett Nose M.D.  On: 01/16/2013 08:10   Physical Exam General appearance: alert and no distress Resp: clear to auscultation bilaterally Cardio: regular rate and rhythm GI: normal findings: bowel sounds normal and soft, non-tender HEENT: Lacs healing as expected   Assessment/Plan: Fall TBI w/SAH C2 fx -- per Dr. Danielle Dess, suspect will d/c collar based on flex/ex but they are limited Nasal fx Facial lac -- Local care Anemia, likely ABL -- Check tomorrow Kidney injury, likely acute -- Check tomorrow Multiple medical problems -- Home meds FEN -- Advance diet, SL IV, non-narcotics for pain given age VTE -- SCD's Dispo -- Likely back to SNF, awaiting PT/OT consults. Transfer to floor.    Freeman Caldron, PA-C Pager: [phone] General Trauma PA Pager: [phone]   01/16/2013

## 2013-01-16 NOTE — Progress Notes (Signed)
OT Cancellation Note  Patient Details Name: Casey Moore MRN: [id number] DOB: [birth year]   Cancelled Treatment:    Reason Eval/Treat Not Completed: Other (comment) Pt in transit to new room. Will attempt in am if eval needed for D/C to [facility]. Will discuss with SW.  [facility] Gianlucas Evenson, OTR/L  [phone] 01/16/2013 01/16/2013, 4:47 PM

## 2013-01-16 NOTE — Plan of Care (Signed)
Trama PA paged to let him know that pt has been complaining about his right shoulder. Every time we move pt right shoulder/arm, pt screams. Also, pt has a temp of 102. Feels warm. Waiting for orders.

## 2013-01-16 NOTE — Progress Notes (Signed)
UR completed.  Pt planned for return to facility when stable. CSW aware and involved in that process.   Carlyle Lipa, RN BSN MHA CCM Trauma/Neuro ICU Case Manager [phone]

## 2013-01-17 LAB — BASIC METABOLIC PANEL
BUN: 22 mg/dL (ref 6–23)
CO2: 22 mEq/L (ref 19–32)
Calcium: 8.8 mg/dL (ref 8.4–10.5)
GFR calc non Af Amer: 45 mL/min — ABNORMAL LOW (ref >90)
Glucose, Bld: 90 mg/dL (ref 70–99)
Sodium: 137 mEq/L (ref 135–145)

## 2013-01-17 LAB — CBC
HCT: 29.4 % — ABNORMAL LOW (ref 39.0–52.0)
Hemoglobin: 10 g/dL — ABNORMAL LOW (ref 13.0–17.0)
MCH: 32.2 pg (ref 26.0–34.0)
MCV: 94.5 fL (ref 78.0–100.0)
RBC: 3.11 MIL/uL — ABNORMAL LOW (ref 4.22–5.81)
RDW: 14.3 % (ref 11.5–15.5)

## 2013-01-17 LAB — URINALYSIS, ROUTINE W REFLEX MICROSCOPIC
Bilirubin Urine: NEGATIVE
Glucose, UA: NEGATIVE mg/dL
Protein, ur: NEGATIVE mg/dL
Urobilinogen, UA: 0.2 mg/dL (ref 0.0–1.0)

## 2013-01-17 LAB — URINE MICROSCOPIC-ADD ON

## 2013-01-17 MED ORDER — TRAMADOL HCL 50 MG PO TABS: 50.0000 mg | ORAL_TABLET | Freq: Four times a day (QID) | ORAL | Status: DC | PRN

## 2013-01-17 MED ORDER — BACITRACIN-NEOMYCIN-POLYMYXIN OINTMENT TUBE: 1.0000 "application " | TOPICAL_OINTMENT | Freq: Two times a day (BID) | CUTANEOUS | Status: DC

## 2013-01-17 NOTE — Discharge Summary (Signed)
Physician Discharge Summary  Patient ID: Casey Moore MRN: [id number] DOB/AGE: 10/09/1931 79 y.o.  Admit date: 01/14/2013 Discharge date: 01/17/2013  Discharge Diagnoses Patient Active Problem List   Diagnosis Date Noted  . Anemia, unspecified 01/16/2013  . AKI (acute kidney injury) 01/16/2013  . Fall 01/15/2013  . Closed fracture nasal bone 01/15/2013  . C2 cervical fracture 01/15/2013  . Facial laceration 01/15/2013  . Dementia 01/15/2013  . Osteoarthritis   . Hypertension   . Diverticulosis   . Subarachnoid hemorrhage following injury 01/14/2013    Consultants Dr. Melvenia Beam for ENT  Dr. Barnett Abu for neurosurgery   Procedures Closure of facial lacerations by Dr. Gerhard Munch   HPI: Casey Moore lives at [facility] secondary to severe Alzheimer's dementia and had an unwitnessed fall in which he apparently landed on his face. There was a questionable loss of consciousness. Family states that he is at his baseline mental status. He is only marginally cooperative secondary to his Alzheimer's. Workup included CT scans of his head, face, and cervical spine and showed the above-mentioned injuries. ENT and neurosurgery were consulted and he was admitted to the trauma service for further care.   Hospital Course: ENT recommended no treatment for his non-displaced nasal fracture in the absence of a cosmetic deformity. There was some question about the acuity of the C2 fracture. Because of the patient's dementia he would not keep his cervical collar on consistently. Neurosurgery decided to obtain flexion and extension views of his cervical spine to assess for instability. These were normal and it was decided to allow the patient to continue without a collar. He had some mild elevation of his creatinine which was stable. It is unclear whether this was acute or chronic. He was mobilized with physical and occupational therapies. He was able to return to his previous  facility in stable condition.  We ask that the facility remove his facial sutures on or about 12/20.      Medication List         allopurinol 300 MG tablet  Commonly known as:  ZYLOPRIM  Take 300 mg by mouth daily.     atorvastatin 20 MG tablet  Commonly known as:  LIPITOR  Take 20 mg by mouth daily.     b complex vitamins tablet  Take 1 tablet by mouth daily.     CALCIUM 500 PO  Take 750 mg by mouth daily.     cholecalciferol 1000 UNITS tablet  Commonly known as:  VITAMIN D  Take 1,000 Units by mouth daily.     donepezil 10 MG tablet  Commonly known as:  ARICEPT  Take 10 mg by mouth daily.     lisinopril 40 MG tablet  Commonly known as:  PRINIVIL,ZESTRIL  Take 40 mg by mouth daily.     multivitamin tablet  Take 1 tablet by mouth daily.     neomycin-bacitracin-polymyxin Oint  Commonly known as:  NEOSPORIN  Apply 1 application topically 2 (two) times daily.     SEA-OMEGA 30 1200 MG Caps  Take 1 capsule by mouth daily.     traMADol 50 MG tablet  Commonly known as:  ULTRAM  Take 1-2 tablets (50-100 mg total) by mouth every 6 (six) hours as needed (Pain).     VITAMIN C PO  Take 1 tablet by mouth daily.     vitamin E 400 UNIT capsule  Take 800 Units by mouth daily.  Follow-up Information   Schedule an appointment as soon as possible for a visit with Stefani Dama, MD.   Specialty:  Neurosurgery   Contact information:   [address] [place] [address] [phone]       Call [facility]. (As needed)    Contact information:   [address] [place] [address] [phone]       Schedule an appointment as soon as possible for a visit with Gaspar Garbe, MD. (Follow up on elevated creatinine)    Specialty:  Internal Medicine   Contact information:   [address] [facility] [place] [address] [phone]       Signed: Freeman Caldron, PA-C Pager:  [phone] General Trauma PA Pager: [phone]  01/17/2013, 11:33 AM

## 2013-01-17 NOTE — Clinical Social Work Note (Signed)
Clinical Social Worker continuing to follow patient and family for support and discharge planning needs.  CSW spoke with Bradly Bienenstock at [facility] who states that patient is able to return today.  CSW has submitted all clinicals and FL2 including medications.  Clinical Social Worker facilitated patient discharge including contacting patient family and facility to confirm patient discharge plans.  Clinical information faxed to facility and family agreeable with plan.  CSW arranged ambulance transport via [facility] to [facility].  RN to call report prior to discharge.  Clinical Social Worker will sign off for now as social work intervention is no longer needed. Please consult [place] again if new need arises.  Macario Golds, [place] [phone]

## 2013-01-17 NOTE — Progress Notes (Signed)
Trauma Service Note  Subjective: No changes overnight   Objective: Vital signs in last 24 hours: Temp:  [98.1 F (36.7 C)-102.2 F (39 C)] 98.4 F (36.9 C) (12/17 0518) Pulse Rate:  [61-86] 61 (12/17 0518) Resp:  [18-20] 18 (12/17 0518) BP: (107-141)/(57-77) 124/72 mmHg (12/17 0518) SpO2:  [93 %-100 %] 100 % (12/17 0518)    Intake/Output from previous day: 12/16 0701 - 12/17 0700 In: 275 [P.O.:50; I.V.:225] Out: 2200 [Urine:2200] Intake/Output this shift: Total I/O In: 360 [P.O.:360] Out: -   General: No distress  Lungs: Clear  Abd: soft, nontender.   Extremities: Elbow X-ray negative  Neuro: The same.  Lab Results: CBC   Recent Labs  01/15/13 0405 01/17/13 0545  WBC 5.9 6.9  HGB 9.1* 10.0*  HCT 26.6* 29.4*  PLT 159 173   BMET  Recent Labs  01/15/13 0405 01/17/13 0545  NA 138 137  K 4.9 4.3  CL 105 104  CO2 25 22  GLUCOSE 107* 90  BUN 21 22  CREATININE 1.41* 1.43*  CALCIUM 9.2 8.8   PT/INR  Recent Labs  01/14/13 1117  LABPROT 14.1  INR 1.11   ABG No results found for this basename: PHART, PCO2, PO2, HCO3,  in the last 72 hours  Studies/Results: Dg Elbow Complete Right  01/16/2013   CLINICAL DATA:  Right arm pain post fall, pain laterally and posteriorly  EXAM: RIGHT ELBOW - COMPLETE 3+ VIEW  COMPARISON:  None  FINDINGS: Nonstandard positioning.  Bones appear slightly demineralized.  Joint spaces preserved.  Tiny olecranon spur.  No definite acute fracture, dislocation or bone destruction.  IMPRESSION: No definite acute osseous abnormalities.   Electronically Signed   By: Ulyses Southward M.D.   On: 01/16/2013 21:01   Dg Cerv Spine Flex&ext Only  01/16/2013   CLINICAL DATA:  Type 2 odontoid fracture, possibly chronic.  EXAM: CERVICAL SPINE - FLEXION AND EXTENSION VIEWS ONLY  COMPARISON:  CT 01/14/2013  FINDINGS: Study is severely limited my overlying shoulders. Cannot visualize below the C3 level. I see no definite change in alignment with  limited flexion and extension.  IMPRESSION: No definite change in the upper cervical spine with limited flexion and extension.   Electronically Signed   By: Charlett Nose M.D.   On: 01/16/2013 08:10    Anti-infectives: Anti-infectives   None      Assessment/Plan: s/p  Advance diet Transfer or discharge to SNF when available.  LOS: 3 days   Marta Lamas. Gae Bon, MD, FACS [phone] Trauma Surgeon 01/17/2013

## 2013-01-17 NOTE — Progress Notes (Signed)
Patient is discharged from room 4N21 at this time. Alert and in stable condition. IV site d/c'd. Report called to receiving nurse at [facility] house. Transported by [facility] via stretcher and took package with them.

## 2013-01-17 NOTE — Care Management Note (Signed)
  Page 1 of 1   01/17/2013     11:49:25 AM   CARE MANAGEMENT NOTE 01/17/2013  Patient:  ANTHON, HARPOLE   Account Number:  [id number]  Date Initiated:  01/17/2013  Documentation initiated by:  Ronny Flurry  Subjective/Objective Assessment:     Action/Plan:   Anticipated DC Date:  01/17/2013   Anticipated DC Plan:  HOME W HOME HEALTH SERVICES         Choice offered to / List presented to:          [facility] arranged  HH-2 PT  HH-3 OT      Status of service:   Medicare Important Message given?   (If response is "NO", the following Medicare IM given date fields will be blank) Date Medicare IM given:   Date Additional Medicare IM given:    Discharge Disposition:    Per UR Regulation:    If discussed at Long Length of Stay Meetings, dates discussed:    Comments:  01-17-13 Patient from [facility] [id number], spoke with Alma . [facility] has their own in house rehab , instructed to fax  orders for home health PT / OT and Aniceto Boss will set same up . Same faxed to [phone] .  Ronny Flurry RN BSN [phone]

## 2013-01-19 LAB — URINE CULTURE: Colony Count: >=100000

## 2013-02-23 ENCOUNTER — Inpatient Hospital Stay (HOSPITAL_COMMUNITY): Payer: 59

## 2013-02-23 ENCOUNTER — Inpatient Hospital Stay (HOSPITAL_COMMUNITY)
Admission: EM | Admit: 2013-02-23 | Discharge: 2013-02-28 | DRG: 280 | Disposition: A | Discharge status: Skilled Nursing Facility | Payer: 59 | Attending: Cardiovascular Disease | Admitting: Cardiovascular Disease

## 2013-02-23 ENCOUNTER — Emergency Department (HOSPITAL_COMMUNITY): Payer: 59

## 2013-02-23 ENCOUNTER — Encounter (HOSPITAL_COMMUNITY): Payer: Self-pay | Admitting: Emergency Medicine

## 2013-02-23 DIAGNOSIS — K7200 Acute and subacute hepatic failure without coma: Secondary | ICD-10-CM | POA: Diagnosis present

## 2013-02-23 DIAGNOSIS — F02818 Dementia in other diseases classified elsewhere, unspecified severity, with other behavioral disturbance: Secondary | ICD-10-CM | POA: Diagnosis present

## 2013-02-23 DIAGNOSIS — L97509 Non-pressure chronic ulcer of other part of unspecified foot with unspecified severity: Secondary | ICD-10-CM | POA: Diagnosis present

## 2013-02-23 DIAGNOSIS — I824Z1 Acute embolism and thrombosis of unspecified deep veins of right distal lower extremity: Secondary | ICD-10-CM

## 2013-02-23 DIAGNOSIS — R0602 Shortness of breath: Secondary | ICD-10-CM | POA: Diagnosis present

## 2013-02-23 DIAGNOSIS — Z85828 Personal history of other malignant neoplasm of skin: Secondary | ICD-10-CM

## 2013-02-23 DIAGNOSIS — I10 Essential (primary) hypertension: Secondary | ICD-10-CM | POA: Diagnosis present

## 2013-02-23 DIAGNOSIS — M1990 Unspecified osteoarthritis, unspecified site: Secondary | ICD-10-CM | POA: Diagnosis present

## 2013-02-23 DIAGNOSIS — I472 Ventricular tachycardia, unspecified: Secondary | ICD-10-CM | POA: Diagnosis present

## 2013-02-23 DIAGNOSIS — Z79899 Other long term (current) drug therapy: Secondary | ICD-10-CM

## 2013-02-23 DIAGNOSIS — I4729 Other ventricular tachycardia: Secondary | ICD-10-CM | POA: Diagnosis present

## 2013-02-23 DIAGNOSIS — J9600 Acute respiratory failure, unspecified whether with hypoxia or hypercapnia: Secondary | ICD-10-CM | POA: Diagnosis present

## 2013-02-23 DIAGNOSIS — M889 Osteitis deformans of unspecified bone: Secondary | ICD-10-CM | POA: Diagnosis present

## 2013-02-23 DIAGNOSIS — Z9181 History of falling: Secondary | ICD-10-CM | POA: Diagnosis not present

## 2013-02-23 DIAGNOSIS — Z8546 Personal history of malignant neoplasm of prostate: Secondary | ICD-10-CM

## 2013-02-23 DIAGNOSIS — E870 Hyperosmolality and hypernatremia: Secondary | ICD-10-CM | POA: Diagnosis present

## 2013-02-23 DIAGNOSIS — F0280 Dementia in other diseases classified elsewhere without behavioral disturbance: Secondary | ICD-10-CM | POA: Diagnosis present

## 2013-02-23 DIAGNOSIS — I214 Non-ST elevation (NSTEMI) myocardial infarction: Secondary | ICD-10-CM | POA: Diagnosis present

## 2013-02-23 DIAGNOSIS — W19XXXA Unspecified fall, initial encounter: Secondary | ICD-10-CM

## 2013-02-23 DIAGNOSIS — G309 Alzheimer's disease, unspecified: Secondary | ICD-10-CM | POA: Diagnosis present

## 2013-02-23 DIAGNOSIS — D649 Anemia, unspecified: Secondary | ICD-10-CM | POA: Diagnosis present

## 2013-02-23 DIAGNOSIS — N179 Acute kidney failure, unspecified: Secondary | ICD-10-CM | POA: Diagnosis present

## 2013-02-23 DIAGNOSIS — I824Y9 Acute embolism and thrombosis of unspecified deep veins of unspecified proximal lower extremity: Secondary | ICD-10-CM | POA: Diagnosis present

## 2013-02-23 DIAGNOSIS — R570 Cardiogenic shock: Secondary | ICD-10-CM

## 2013-02-23 DIAGNOSIS — Z66 Do not resuscitate: Secondary | ICD-10-CM | POA: Diagnosis present

## 2013-02-23 DIAGNOSIS — F0281 Dementia in other diseases classified elsewhere with behavioral disturbance: Secondary | ICD-10-CM | POA: Diagnosis present

## 2013-02-23 DIAGNOSIS — R7989 Other specified abnormal findings of blood chemistry: Secondary | ICD-10-CM

## 2013-02-23 DIAGNOSIS — I4891 Unspecified atrial fibrillation: Secondary | ICD-10-CM | POA: Diagnosis present

## 2013-02-23 DIAGNOSIS — I2589 Other forms of chronic ischemic heart disease: Secondary | ICD-10-CM | POA: Diagnosis present

## 2013-02-23 DIAGNOSIS — F0390 Unspecified dementia without behavioral disturbance: Secondary | ICD-10-CM

## 2013-02-23 DIAGNOSIS — L97409 Non-pressure chronic ulcer of unspecified heel and midfoot with unspecified severity: Secondary | ICD-10-CM | POA: Diagnosis present

## 2013-02-23 DIAGNOSIS — R579 Shock, unspecified: Secondary | ICD-10-CM

## 2013-02-23 DIAGNOSIS — I255 Ischemic cardiomyopathy: Secondary | ICD-10-CM

## 2013-02-23 DIAGNOSIS — I4721 Torsades de pointes: Secondary | ICD-10-CM | POA: Diagnosis present

## 2013-02-23 DIAGNOSIS — R778 Other specified abnormalities of plasma proteins: Secondary | ICD-10-CM

## 2013-02-23 DIAGNOSIS — Z96659 Presence of unspecified artificial knee joint: Secondary | ICD-10-CM | POA: Diagnosis not present

## 2013-02-23 LAB — COMPREHENSIVE METABOLIC PANEL
ALBUMIN: 2.5 g/dL — AB (ref 3.5–5.2)
ALT: 80 U/L — ABNORMAL HIGH (ref 0–53)
AST: 106 U/L — ABNORMAL HIGH (ref 0–37)
Alkaline Phosphatase: 130 U/L — ABNORMAL HIGH (ref 39–117)
BILIRUBIN TOTAL: 0.3 mg/dL (ref 0.3–1.2)
BUN: 82 mg/dL — AB (ref 6–23)
CALCIUM: 9.7 mg/dL (ref 8.4–10.5)
CO2: 13 mEq/L — ABNORMAL LOW (ref 19–32)
CREATININE: 3.6 mg/dL — AB (ref 0.50–1.35)
Chloride: 112 mEq/L (ref 96–112)
GFR calc Af Amer: 17 mL/min — ABNORMAL LOW (ref >90)
GFR calc non Af Amer: 15 mL/min — ABNORMAL LOW (ref >90)
Glucose, Bld: 193 mg/dL — ABNORMAL HIGH (ref 70–99)
Potassium: 5.3 mEq/L (ref 3.7–5.3)
Sodium: 148 mEq/L — ABNORMAL HIGH (ref 137–147)
TOTAL PROTEIN: 7.1 g/dL (ref 6.0–8.3)

## 2013-02-23 LAB — CBC WITH DIFFERENTIAL/PLATELET
BASOS ABS: 0 10*3/uL (ref 0.0–0.1)
Basophils Relative: 0 % (ref 0–1)
EOS ABS: 0 10*3/uL (ref 0.0–0.7)
Eosinophils Relative: 0 % (ref 0–5)
HEMATOCRIT: 37.5 % — AB (ref 39.0–52.0)
Hemoglobin: 12.4 g/dL — ABNORMAL LOW (ref 13.0–17.0)
LYMPHS ABS: 0.9 10*3/uL (ref 0.7–4.0)
Lymphocytes Relative: 6 % — ABNORMAL LOW (ref 12–46)
MCH: 31.5 pg (ref 26.0–34.0)
MCHC: 33.1 g/dL (ref 30.0–36.0)
MCV: 95.2 fL (ref 78.0–100.0)
Monocytes Absolute: 0.6 10*3/uL (ref 0.1–1.0)
Monocytes Relative: 4 % (ref 3–12)
Neutro Abs: 12.8 10*3/uL — ABNORMAL HIGH (ref 1.7–7.7)
Neutrophils Relative %: 90 % — ABNORMAL HIGH (ref 43–77)
PLATELETS: 269 10*3/uL (ref 150–400)
RBC: 3.94 MIL/uL — ABNORMAL LOW (ref 4.22–5.81)
RDW: 15.9 % — AB (ref 11.5–15.5)
WBC: 14.3 10*3/uL — ABNORMAL HIGH (ref 4.0–10.5)

## 2013-02-23 LAB — PRO B NATRIURETIC PEPTIDE: PRO B NATRI PEPTIDE: 5417 pg/mL — AB (ref 0–450)

## 2013-02-23 LAB — POCT I-STAT TROPONIN I: Troponin i, poc: 0.59 ng/mL (ref 0.00–0.08)

## 2013-02-23 LAB — CG4 I-STAT (LACTIC ACID): Lactic Acid, Venous: 3.58 mmol/L — ABNORMAL HIGH (ref 0.5–2.2)

## 2013-02-23 MED ORDER — AMIODARONE HCL IN DEXTROSE 360-4.14 MG/200ML-% IV SOLN
60.0000 mg/h | INTRAVENOUS | Status: DC
Start: 2013-02-23 — End: 2013-02-23

## 2013-02-23 MED ORDER — MIDAZOLAM HCL 2 MG/2ML IJ SOLN: 4.0000 mg | Freq: Once | INTRAMUSCULAR | Status: DC

## 2013-02-23 MED ORDER — PHENYLEPHRINE HCL 10 MG/ML IJ SOLN
30.0000 ug/min | INTRAVENOUS | Status: DC
  Administered 2013-02-24: 30 ug/min via INTRAVENOUS
  Filled 2013-02-23: qty 1

## 2013-02-23 MED ORDER — ASPIRIN EC 81 MG PO TBEC
81.0000 mg | DELAYED_RELEASE_TABLET | Freq: Every day | ORAL | Status: DC
  Administered 2013-02-24 – 2013-02-28 (×5): 81 mg via ORAL
  Filled 2013-02-23 (×5): qty 1

## 2013-02-23 MED ORDER — SODIUM CHLORIDE 0.9 % IV BOLUS (SEPSIS)
500.0000 mL | Freq: Once | INTRAVENOUS | Status: AC
  Administered 2013-02-24: 500 mL via INTRAVENOUS

## 2013-02-23 MED ORDER — FENTANYL CITRATE 0.05 MG/ML IJ SOLN
100.0000 ug | Freq: Once | INTRAMUSCULAR | Status: DC
  Filled 2013-02-23: qty 2

## 2013-02-23 MED ORDER — HALOPERIDOL LACTATE 5 MG/ML IJ SOLN: 5.0000 mg | Freq: Four times a day (QID) | INTRAMUSCULAR | Status: DC | PRN

## 2013-02-23 MED ORDER — MIDAZOLAM HCL 2 MG/2ML IJ SOLN
INTRAMUSCULAR | Status: AC | PRN
  Administered 2013-02-23: 2 mg via INTRAVENOUS

## 2013-02-23 MED ORDER — PROPOFOL 10 MG/ML IV BOLUS: 0.5000 mg/kg | Freq: Once | INTRAVENOUS | Status: DC

## 2013-02-23 MED ORDER — AMIODARONE HCL IN DEXTROSE 360-4.14 MG/200ML-% IV SOLN: 30.0000 mg/h | INTRAVENOUS | Status: DC

## 2013-02-23 MED ORDER — HEPARIN (PORCINE) IN NACL 100-0.45 UNIT/ML-% IJ SOLN
1450.0000 [IU]/h | INTRAMUSCULAR | Status: DC
  Administered 2013-02-24: 1200 [IU]/h via INTRAVENOUS
  Administered 2013-02-24: 1000 [IU]/h via INTRAVENOUS
  Administered 2013-02-25 – 2013-02-27 (×3): 1450 [IU]/h via INTRAVENOUS
  Filled 2013-02-23 (×9): qty 250

## 2013-02-23 MED ORDER — DONEPEZIL HCL 10 MG PO TABS
10.0000 mg | ORAL_TABLET | Freq: Every day | ORAL | Status: DC
  Administered 2013-02-24 – 2013-02-28 (×5): 10 mg via ORAL
  Filled 2013-02-23 (×5): qty 1

## 2013-02-23 MED ORDER — ACETAMINOPHEN 325 MG PO TABS: 650.0000 mg | ORAL_TABLET | ORAL | Status: DC | PRN

## 2013-02-23 MED ORDER — PROPOFOL 10 MG/ML IV EMUL
INTRAVENOUS | Status: AC
  Filled 2013-02-23: qty 100

## 2013-02-23 MED ORDER — FENTANYL CITRATE 0.05 MG/ML IJ SOLN
INTRAMUSCULAR | Status: AC | PRN
  Administered 2013-02-23: 50 ug via INTRAVENOUS

## 2013-02-23 MED ORDER — ONDANSETRON HCL 4 MG/2ML IJ SOLN: 4.0000 mg | Freq: Four times a day (QID) | INTRAMUSCULAR | Status: DC | PRN

## 2013-02-23 MED ORDER — HEPARIN BOLUS VIA INFUSION
4000.0000 [IU] | Freq: Once | INTRAVENOUS | Status: AC
  Administered 2013-02-23: 4000 [IU] via INTRAVENOUS
  Filled 2013-02-23: qty 4000

## 2013-02-23 MED ORDER — MIDAZOLAM HCL 2 MG/2ML IJ SOLN
INTRAMUSCULAR | Status: AC
  Filled 2013-02-23: qty 4

## 2013-02-23 MED ORDER — AMIODARONE LOAD VIA INFUSION
150.0000 mg | Freq: Once | INTRAVENOUS | Status: DC
  Filled 2013-02-23: qty 83.34

## 2013-02-23 NOTE — ED Notes (Signed)
Pt to ED via [facility] from [facility] memory unit for evaluation of increased weakness and shortness of breath.  Wife visits pt daily and tonight noticed that he was working harder to breathe- lung sounds clear throughout, respirations 32/min.  EMS noted pt to be in a-fib, no hx of the same.  Pt alert but non-verbal upon arrival to ED- family reports that is pt baseline.  Pt does not walk, bed ridden per family.

## 2013-02-23 NOTE — ED Notes (Addendum)
Shock delivered  

## 2013-02-23 NOTE — Progress Notes (Addendum)
ANTICOAGULATION/ANTIBIOTIC CONSULT NOTE - Initial Consult  Pharmacy Consult for Heparin  Indication: atrial fibrillation, new onset  Pharmacy Consult: Vancomycin, Cefepime Indication: r/o sepsis  No Known Allergies  Patient Measurements: HDW: ~86kg   Vital Signs: Temp: 97.9 F (36.6 C) (01/23 2044) Temp src: Oral (01/23 2044) BP: 74/54 mmHg (01/23 2318) Pulse Rate: 86 (01/23 2318)  Labs:  Recent Labs  02/23/13 2052  HGB 12.4*  HCT 37.5*  PLT 269  CREATININE 3.60*   Medical History: Past Medical History  Diagnosis Date  . Osteoarthritis   . Hypertension   . Hx of adenomatous colonic polyps   . Diverticulosis   . Prostate cancer   . Paget's disease   . Kidney stone   . Sinusitis   . SDAT (senile dementia of Alzheimer's type)     Medications:  No AC PTA  Assessment: 80 y/o M here with new onset afib/RVR s/p cardioversion due to hemodynamic instability to start heparin per pharmacy. Noted renal dysfunction, other labs as above.   Goal of Therapy:  Heparin level 0.3-0.7 units/ml Monitor platelets by anticoagulation protocol: Yes   Plan:  -Heparin 4000 units BOLUS x 1 -Start heparin drip at 1000 units/hr -8 hour HL at 0800 -Daily CBC/HL -F/U cardiology plans  Narda Bonds 02/23/2013,11:18 PM  Also to start broad spectrum antibiotics for r/o sepsis. WBC 14.3, afebrile, hypotensive.   Plan  -Vancomycin 1000 mg IV q48h -Cefepime 1g IV q24h -Azithromycin per MD -Trend WBC, temp, renal function  -May need dose adjustments, drug levels with changes in renal function  -F/U cultures, imaging -Drug levels as indicated   JLedford, PharmD

## 2013-02-23 NOTE — ED Provider Notes (Signed)
CSN: [id number]     Arrival date & time 02/23/13  2034 History   First MD Initiated Contact with Patient 02/23/13 2111     Chief Complaint  Patient presents with  . Weakness   (Consider location/radiation/quality/duration/timing/severity/associated sxs/prior Treatment) HPI Comments: Patient is a 80 year old male with history of dementia. He is a resident of an extended care facility where his wife found him this evening to be weak and short of breath. The patient adds little additional history due to to his dementia. A level V caveat therefore applies.  Patient is a 80 y.o. male presenting with weakness. The history is provided by the patient.  Weakness This is a new problem. The current episode started 3 to 5 hours ago. The problem occurs constantly. The problem has been gradually worsening. Associated symptoms include shortness of breath. Pertinent negatives include no chest pain. Nothing aggravates the symptoms. Nothing relieves the symptoms. He has tried nothing for the symptoms. The treatment provided no relief.    Past Medical History  Diagnosis Date  . Osteoarthritis   . Hypertension   . Hx of adenomatous colonic polyps   . Diverticulosis   . Prostate cancer   . Paget's disease   . Kidney stone   . Sinusitis   . SDAT (senile dementia of Alzheimer's type)    Past Surgical History  Procedure Laterality Date  . Cataract extraction      left-with implant  . Total knee arthroplasty      bilateral  . Mastoid surgery      x 5  . Tonsillectomy and adenoidectomy    . Appendectomy    . Knee surgery      bilateral  . Prostatectomy    . Skin cancer excision      right hip   Family History  Problem Relation Age of Onset  . Colon cancer Neg Hx   . Prostate cancer Father   . Dementia Mother   . Throat cancer Paternal Grandfather   . Prostate cancer Brother    History  Substance Use Topics  . Smoking status: Never Smoker   . Smokeless tobacco: Never Used  . Alcohol Use:  Yes     Comment: socially    Review of Systems  Unable to perform ROS Respiratory: Positive for shortness of breath.   Cardiovascular: Negative for chest pain.  Neurological: Positive for weakness.    Allergies  Review of patient's allergies indicates no known allergies.  Home Medications   Current Outpatient Rx  Name  Route  Sig  Dispense  Refill  . allopurinol (ZYLOPRIM) 300 MG tablet   Oral   Take 300 mg by mouth daily.          Marland Kitchen atorvastatin (LIPITOR) 20 MG tablet   Oral   Take 20 mg by mouth daily.          Marland Kitchen donepezil (ARICEPT) 10 MG tablet   Oral   Take 10 mg by mouth daily.         Marland Kitchen lisinopril (PRINIVIL,ZESTRIL) 40 MG tablet   Oral   Take 40 mg by mouth daily.          Marland Kitchen LORazepam (ATIVAN) 0.5 MG tablet   Oral   Take 0.5 mg by mouth 2 (two) times daily as needed for anxiety.         . Multiple Vitamin (MULTIVITAMIN) tablet   Oral   Take 1 tablet by mouth daily.         Marland Kitchen  polymixin-bacitracin (POLYSPORIN) 500-10000 UNIT/GM OINT ointment   Topical   Apply 1 application topically 2 (two) times daily as needed (for skin irritations).         . vitamin C (ASCORBIC ACID) 500 MG tablet   Oral   Take 500 mg by mouth daily.          BP 70/41  Temp(Src) 97.9 F (36.6 C) (Oral)  Resp 32  SpO2 99% Physical Exam  Nursing note and vitals reviewed. Constitutional:  Patient is a 80 year old male who appears pale and weak. He is alert but slow to respond.  HENT:  Head: Normocephalic and atraumatic.  Mouth/Throat: Oropharynx is clear and moist.  Eyes: EOM are normal. Pupils are equal, round, and reactive to light.  Neck: Normal range of motion. Neck supple.  Cardiovascular: Normal rate, regular rhythm and normal heart sounds.   No murmur heard. Pulmonary/Chest: Effort normal and breath sounds normal. No respiratory distress. He has no wheezes.  Abdominal: Soft. Bowel sounds are normal. He exhibits no distension. There is no tenderness.   Musculoskeletal: Normal range of motion. He exhibits no edema.  Neurological: He is alert.  Neurologic exam is difficult secondary to dementia, however he moves all 4 extremities and is interactive.  Skin: Skin is warm and dry.    ED Course  CARDIOVERSION Date/Time: 02/23/2013 10:45 PM Performed by: Veryl Speak Authorized by: Veryl Speak Consent: Verbal consent obtained. The procedure was performed in an emergent situation. Risks and benefits: risks, benefits and alternatives were discussed Consent given by: spouse Patient understanding: patient does not state understanding of the procedure being performed Patient consent: the patient's understanding of the procedure matches consent given Patient identity confirmed: verbally with patient Time out: Immediately prior to procedure a "time out" was called to verify the correct patient, procedure, equipment, support staff and site/side marked as required. Patient sedated: yes Sedation type: moderate (conscious) sedation Sedatives: midazolam Analgesia: fentanyl Sedation start date/time: 02/23/2013 10:40 PM Sedation end date/time: 02/23/2013 11:02 PM Cardioversion basis: emergent Pre-procedure rhythm: atrial fibrillation Patient position: patient was placed in a supine position Chest area: chest area exposed Electrodes: pads Electrodes placed: anterior-posterior Number of attempts: 1 Attempt 1 mode: synchronous Attempt 1 shock (in Joules): 150 Attempt 1 outcome: conversion to normal sinus rhythm Post-procedure rhythm: normal sinus rhythm Complications: no complications Patient tolerance: Patient tolerated the procedure well with no immediate complications.   (including critical care time) Labs Review Labs Reviewed  CBC WITH DIFFERENTIAL  COMPREHENSIVE METABOLIC PANEL  PRO B NATRIURETIC PEPTIDE   Imaging Review No results found.  EKG Interpretation    Date/Time:  Friday February 23 2013 20:44:21 EST Ventricular Rate:   149 PR Interval:  80 QRS Duration: 132 QT Interval:  310 QTC Calculation: 488 R Axis:   -33 Text Interpretation:  Wide-QRS tachycardia h rapid ventricular response                 W Confirmed by Beau Fanny  MD, Sanjna Haskew ([id number]) on 02/23/2013 9:20:28 PM            MDM  No diagnosis found. Patient is a 80 year old male with history of dementia. He was brought from the extended care facility for evaluation of weakness and difficulty breathing. Workup reveals an EKG with atrial fibrillation and rapid ventricular response. He was found to have a elevated troponin of 0.59. Is also found to have an elevated BUN and creatinine. He was given IV hydration however remained hypotensive while in the emergency department. I  discussed the case with cardiology Dr. Louann Sjogren who came to help in the patient's care. Decision was made to proceed with cardioversion do to his hemodynamic instability.  He was given fentanyl and Versed and was successfully converted to a sinus rhythm with 150 J of synchronized electricity. He'll be started on a heparin drip and admitted to the cardiology service.  CRITICAL CARE Performed by: Veryl Speak Total critical care time: 60 minutes Critical care time was exclusive of separately billable procedures and treating other patients. Critical care was necessary to treat or prevent imminent or life-threatening deterioration. Critical care was time spent personally by me on the following activities: development of treatment plan with patient and/or surrogate as well as nursing, discussions with consultants, evaluation of patient's response to treatment, examination of patient, obtaining history from patient or surrogate, ordering and performing treatments and interventions, ordering and review of laboratory studies, ordering and review of radiographic studies, pulse oximetry and re-evaluation of patient's condition.     Veryl Speak, MD 02/23/13 [phone]

## 2013-02-23 NOTE — ED Notes (Signed)
ECG capture/sinus rhythm

## 2013-02-23 NOTE — ED Notes (Addendum)
Dr Stark Jock given a copy of lactic acid results 3.58 and tropoinin results .Asher

## 2013-02-23 NOTE — H&P (Signed)
Morey P Haser is an 80 y.o. male.   Chief Complaint: Shortness of breath HPI: Mr Morin is a 80 year old male with history of hypertension, Paget's disease and Alzheimer's dementia. He resides at an adult care facility. His wife visits with him daily. Today she noted him to be more short of breath during dinner. He was having trouble eating as well. This was a change from yesterday. He denies any symptoms, but his wife states he would be unable to provide this information. She is unaware of any issues during the day. She thought that his vital signs were checked daily, but today his blood pressure was not checked. She is unaware of his heart rate today. He has no history of atrial fibrillation, heart failure or coronary artery disease. He is largely sedentary because of balance problems. He has no history of intra-cranial bleeding or GI bleeding.  In the emergency department he was noted to be in atrial fibrillation with rapid ventricular response. His blood pressure was low, systolics 70-90 and MAP around 55-65. His lactate and creatinine were elevated. It did not seem that he was altered beyond his baseline. He was given fluid boluses without improvement in his blood pressure. Given the signs of hypoperfusion the decision was made to cardiovert. He was sedated with fentanyl and versed and he received synchronized cardioversion at 200j. This was successful with sinus rhythm with some PACs and PVCs. His blood pressure still remained on the lower side. He mental status was about the same and his wife thought this was his baseline.   Past Medical History  Diagnosis Date  . Osteoarthritis   . Hypertension   . Hx of adenomatous colonic polyps   . Diverticulosis   . Prostate cancer   . Paget's disease   . Kidney stone   . Sinusitis   . SDAT (senile dementia of Alzheimer's type)     Past Surgical History  Procedure Laterality Date  . Cataract extraction      left-with implant  . Total knee  arthroplasty      bilateral  . Mastoid surgery      x 5  . Tonsillectomy and adenoidectomy    . Appendectomy    . Knee surgery      bilateral  . Prostatectomy    . Skin cancer excision      right hip    Family History  Problem Relation Age of Onset  . Colon cancer Neg Hx   . Prostate cancer Father   . Dementia Mother   . Throat cancer Paternal Grandfather   . Prostate cancer Brother    Social History:  reports that he has never smoked. He has never used smokeless tobacco. He reports that he drinks alcohol. He reports that he does not use illicit drugs.  Allergies: No Known Allergies   (Not in a hospital admission)  Results for orders placed during the hospital encounter of 02/23/13 (from the past 48 hour(s))  CBC WITH DIFFERENTIAL     Status: Abnormal   Collection Time    02/23/13  8:52 PM      Result Value Range   WBC 14.3 (*) 4.0 - 10.5 K/uL   RBC 3.94 (*) 4.22 - 5.81 MIL/uL   Hemoglobin 12.4 (*) 13.0 - 17.0 g/dL   HCT 37.5 (*) 39.0 - 52.0 %   MCV 95.2  78.0 - 100.0 fL   MCH 31.5  26.0 - 34.0 pg   MCHC 33.1  30.0 - 36.0   g/dL   RDW 15.9 (*) 11.5 - 15.5 %   Platelets 269  150 - 400 K/uL   Neutrophils Relative % 90 (*) 43 - 77 %   Lymphocytes Relative 6 (*) 12 - 46 %   Monocytes Relative 4  3 - 12 %   Eosinophils Relative 0  0 - 5 %   Basophils Relative 0  0 - 1 %   Neutro Abs 12.8 (*) 1.7 - 7.7 K/uL   Lymphs Abs 0.9  0.7 - 4.0 K/uL   Monocytes Absolute 0.6  0.1 - 1.0 K/uL   Eosinophils Absolute 0.0  0.0 - 0.7 K/uL   Basophils Absolute 0.0  0.0 - 0.1 K/uL   Smear Review MORPHOLOGY UNREMARKABLE    COMPREHENSIVE METABOLIC PANEL     Status: Abnormal   Collection Time    02/23/13  8:52 PM      Result Value Range   Sodium 148 (*) 137 - 147 mEq/L   Potassium 5.3  3.7 - 5.3 mEq/L   Chloride 112  96 - 112 mEq/L   CO2 13 (*) 19 - 32 mEq/L   Glucose, Bld 193 (*) 70 - 99 mg/dL   BUN 82 (*) 6 - 23 mg/dL   Creatinine, Ser 3.60 (*) 0.50 - 1.35 mg/dL   Calcium 9.7  8.4  - 10.5 mg/dL   Total Protein 7.1  6.0 - 8.3 g/dL   Albumin 2.5 (*) 3.5 - 5.2 g/dL   AST 106 (*) 0 - 37 U/L   ALT 80 (*) 0 - 53 U/L   Alkaline Phosphatase 130 (*) 39 - 117 U/L   Total Bilirubin 0.3  0.3 - 1.2 mg/dL   GFR calc non Af Amer 15 (*) >90 mL/min   GFR calc Af Amer 17 (*) >90 mL/min   Comment: (NOTE)     The eGFR has been calculated using the CKD EPI equation.     This calculation has not been validated in all clinical situations.     eGFR's persistently <90 mL/min signify possible Chronic Kidney     Disease.  PRO B NATRIURETIC PEPTIDE     Status: Abnormal   Collection Time    02/23/13  8:52 PM      Result Value Range   Pro B Natriuretic peptide (BNP) 5417.0 (*) 0 - 450 pg/mL  POCT I-STAT TROPONIN I     Status: Abnormal   Collection Time    02/23/13  9:17 PM      Result Value Range   Troponin i, poc 0.59 (*) 0.00 - 0.08 ng/mL   Comment NOTIFIED PHYSICIAN     Comment 3            Comment: Due to the release kinetics of cTnI,     a negative result within the first hours     of the onset of symptoms does not rule out     myocardial infarction with certainty.     If myocardial infarction is still suspected,     repeat the test at appropriate intervals.  CG4 I-STAT (LACTIC ACID)     Status: Abnormal   Collection Time    02/23/13  9:20 PM      Result Value Range   Lactic Acid, Venous 3.58 (*) 0.5 - 2.2 mmol/L   No results found.  Review of Systems  Unable to perform ROS: dementia    Blood pressure 80/46, pulse 96, temperature 97.9 F (36.6 C), temperature source Oral, resp. rate 22,  SpO2 100.00%. Physical Exam  Constitutional: No distress.  Neck: No JVD present.  Cardiovascular: S1 normal, normal heart sounds, intact distal pulses and normal pulses.  An irregularly irregular rhythm present.  Extrasystoles are present. Tachycardia present.   Respiratory: Breath sounds normal.  Poor effort. Decreased sounds at the bases bilaterally with rales.   GI: Soft. Bowel  sounds are normal. There is no tenderness. There is no guarding.  Musculoskeletal: Normal range of motion. He exhibits no edema.  Neurological: He is alert. He is disoriented.  Skin: Skin is warm and dry. He is not diaphoretic.     ECG: shows Atrial fibrillation with rapid ventricular response. He was then cardioverted to a sinus rhythm with some PACs.   Assessment/Plan Mr Zeiss is a 80 year old male with history of hypertension, dyslipidemia and Alzheimer's dementia which seems to be severe. He presents today with new diagnosis of atrial fibrillation with rapid ventricular response. It sounds like he became symptomatic from this earlier today although his dementia makes determining the time course of this difficult. Unfortunately, daily vitals were not supplied to [place] by his adult care facility. His wife brought him in for SOB. His creatinine and LFT's were elevated and he was hypotensive in the ED. He had a cardioversion and was started on heparin drip and amiodarone. I will rule out other causes like infection with chest imaging. He does have some ulcerations on the right heel and left second toe, but they don't look to be deep enough to be causing systemic infection. His troponin is elevated. I will treat this as a type II NSTEMI and will not give dual antiplatelet therapy. I will order a 2d TTE to assess his LV function which I suspect will be reduced, hopefully from tachycardia induced cardiomyopathy. My hope is that correcting his rate will help with his acute renal failure and elevated LFT's. I talked with his wife about his current critical state. She would like all measures pursued at this point in time.     1-New atrial fibrillation with RVR with shock- amiodarone infusion, heparin drip. He should be anticoagulated for stroke prophylaxis given his age and hypertension. Check TSH and UA.   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 Will  check TTE. Supportive care and rate/rhythm control.   4-Acute renal failure- likely secondary to atrial fibrillation  5-Acute hepatitis- presumed to be congestive hepatopathy from 1 and 2  6-Hypotension- Secondary to AF. Will start phenylephrine if needed  7-Alzheimer's dementia- will continue outpatient medications  8- Diet, NPO for now  [place], Shantea Poulton C 02/23/2013, 11:02 PM

## 2013-02-23 NOTE — ED Notes (Signed)
Casey Moore [phone] (contact wife)

## 2013-02-24 ENCOUNTER — Encounter (HOSPITAL_COMMUNITY): Payer: Self-pay | Admitting: Internal Medicine

## 2013-02-24 ENCOUNTER — Inpatient Hospital Stay (HOSPITAL_COMMUNITY): Payer: 59

## 2013-02-24 DIAGNOSIS — R570 Cardiogenic shock: Secondary | ICD-10-CM

## 2013-02-24 DIAGNOSIS — I369 Nonrheumatic tricuspid valve disorder, unspecified: Secondary | ICD-10-CM

## 2013-02-24 DIAGNOSIS — I4891 Unspecified atrial fibrillation: Principal | ICD-10-CM

## 2013-02-24 DIAGNOSIS — N179 Acute kidney failure, unspecified: Secondary | ICD-10-CM

## 2013-02-24 LAB — PROTIME-INR
INR: 1.41 (ref 0.00–1.49)
PROTHROMBIN TIME: 16.9 s — AB (ref 11.6–15.2)

## 2013-02-24 LAB — COMPREHENSIVE METABOLIC PANEL
ALT: 78 U/L — AB (ref 0–53)
AST: 86 U/L — ABNORMAL HIGH (ref 0–37)
Albumin: 2.1 g/dL — ABNORMAL LOW (ref 3.5–5.2)
Alkaline Phosphatase: 101 U/L (ref 39–117)
BUN: 80 mg/dL — AB (ref 6–23)
CALCIUM: 8.4 mg/dL (ref 8.4–10.5)
CO2: 17 meq/L — AB (ref 19–32)
CREATININE: 2.9 mg/dL — AB (ref 0.50–1.35)
Chloride: 118 mEq/L — ABNORMAL HIGH (ref 96–112)
GFR calc Af Amer: 22 mL/min — ABNORMAL LOW (ref >90)
GFR, EST NON AFRICAN AMERICAN: 19 mL/min — AB (ref >90)
GLUCOSE: 106 mg/dL — AB (ref 70–99)
Potassium: 4.1 mEq/L (ref 3.7–5.3)
SODIUM: 151 meq/L — AB (ref 137–147)
Total Bilirubin: 0.2 mg/dL — ABNORMAL LOW (ref 0.3–1.2)
Total Protein: 5.6 g/dL — ABNORMAL LOW (ref 6.0–8.3)

## 2013-02-24 LAB — PROCALCITONIN: Procalcitonin: 0.63 ng/mL

## 2013-02-24 LAB — LACTIC ACID, PLASMA
LACTIC ACID, VENOUS: 1.3 mmol/L (ref 0.5–2.2)
Lactic Acid, Venous: 1.9 mmol/L (ref 0.5–2.2)

## 2013-02-24 LAB — SODIUM, URINE, RANDOM: Sodium, Ur: 52 mEq/L

## 2013-02-24 LAB — MRSA PCR SCREENING: MRSA BY PCR: NEGATIVE

## 2013-02-24 LAB — GLUCOSE, CAPILLARY
GLUCOSE-CAPILLARY: 86 mg/dL (ref 70–99)
GLUCOSE-CAPILLARY: 98 mg/dL (ref 70–99)
Glucose-Capillary: 82 mg/dL (ref 70–99)
Glucose-Capillary: 87 mg/dL (ref 70–99)
Glucose-Capillary: 96 mg/dL (ref 70–99)

## 2013-02-24 LAB — CBC
HEMATOCRIT: 30.5 % — AB (ref 39.0–52.0)
Hemoglobin: 9.9 g/dL — ABNORMAL LOW (ref 13.0–17.0)
MCH: 31 pg (ref 26.0–34.0)
MCHC: 32.5 g/dL (ref 30.0–36.0)
MCV: 95.6 fL (ref 78.0–100.0)
Platelets: 232 10*3/uL (ref 150–400)
RBC: 3.19 MIL/uL — AB (ref 4.22–5.81)
RDW: 15.8 % — ABNORMAL HIGH (ref 11.5–15.5)
WBC: 14.6 10*3/uL — AB (ref 4.0–10.5)

## 2013-02-24 LAB — URINALYSIS, ROUTINE W REFLEX MICROSCOPIC
BILIRUBIN URINE: NEGATIVE
Glucose, UA: NEGATIVE mg/dL
Ketones, ur: NEGATIVE mg/dL
LEUKOCYTES UA: NEGATIVE
NITRITE: NEGATIVE
PH: 5 (ref 5.0–8.0)
PROTEIN: NEGATIVE mg/dL
Specific Gravity, Urine: 1.019 (ref 1.005–1.030)
Urobilinogen, UA: 0.2 mg/dL (ref 0.0–1.0)

## 2013-02-24 LAB — TROPONIN I
TROPONIN I: 0.42 ng/mL — AB (ref <0.30)
TROPONIN I: 0.35 ng/mL — AB (ref <0.30)
TROPONIN I: 0.3 ng/mL — AB (ref <0.30)

## 2013-02-24 LAB — URINE MICROSCOPIC-ADD ON

## 2013-02-24 LAB — HEPARIN LEVEL (UNFRACTIONATED)
Heparin Unfractionated: 0.22 IU/mL — ABNORMAL LOW (ref 0.30–0.70)
Heparin Unfractionated: 0.26 IU/mL — ABNORMAL LOW (ref 0.30–0.70)

## 2013-02-24 LAB — CORTISOL: CORTISOL PLASMA: 18.6 ug/dL

## 2013-02-24 LAB — TSH: TSH: 0.34 u[IU]/mL — ABNORMAL LOW (ref 0.350–4.500)

## 2013-02-24 LAB — D-DIMER, QUANTITATIVE: D-Dimer, Quant: 9.74 ug/mL-FEU — ABNORMAL HIGH (ref 0.00–0.48)

## 2013-02-24 MED ORDER — PANTOPRAZOLE SODIUM 40 MG IV SOLR
40.0000 mg | INTRAVENOUS | Status: DC
  Administered 2013-02-24: 40 mg via INTRAVENOUS
  Filled 2013-02-24 (×2): qty 40

## 2013-02-24 MED ORDER — PHENYLEPHRINE HCL 10 MG/ML IJ SOLN
30.0000 ug/min | INTRAVENOUS | Status: DC
  Administered 2013-02-24: 50 ug/min via INTRAVENOUS
  Administered 2013-02-24: 40 ug/min via INTRAVENOUS
  Filled 2013-02-24 (×3): qty 4

## 2013-02-24 MED ORDER — INSULIN ASPART 100 UNIT/ML ~~LOC~~ SOLN: 0.0000 [IU] | SUBCUTANEOUS | Status: DC

## 2013-02-24 MED ORDER — DEXTROSE 5 % IV SOLN
500.0000 mg | Freq: Once | INTRAVENOUS | Status: AC
  Administered 2013-02-24: 500 mg via INTRAVENOUS
  Filled 2013-02-24: qty 500

## 2013-02-24 MED ORDER — VANCOMYCIN HCL IN DEXTROSE 1-5 GM/200ML-% IV SOLN
1000.0000 mg | INTRAVENOUS | Status: DC
  Administered 2013-02-24: 1000 mg via INTRAVENOUS
  Filled 2013-02-24: qty 200

## 2013-02-24 MED ORDER — DEXTROSE 5 % IV SOLN
1.0000 g | INTRAVENOUS | Status: DC
  Administered 2013-02-24 – 2013-02-25 (×2): 1 g via INTRAVENOUS
  Filled 2013-02-24 (×2): qty 1

## 2013-02-24 MED ORDER — DEXTROSE 5 % IV SOLN
250.0000 mg | Freq: Every day | INTRAVENOUS | Status: DC
  Administered 2013-02-24: 250 mg via INTRAVENOUS
  Filled 2013-02-24 (×2): qty 250

## 2013-02-24 MED ORDER — SODIUM CHLORIDE 0.45 % IV SOLN
INTRAVENOUS | Status: DC
  Administered 2013-02-24 – 2013-02-25 (×2): via INTRAVENOUS

## 2013-02-24 MED ORDER — SODIUM CHLORIDE 0.9 % IV SOLN
INTRAVENOUS | Status: DC
  Administered 2013-02-24: 02:00:00 via INTRAVENOUS

## 2013-02-24 NOTE — Progress Notes (Signed)
CRITICAL VALUE ALERT  Critical value received: Troponin 0.42  Date of notification:  02/24/13  Time of notification:  0450  Critical value read back:yes  Nurse who received alert:  Mae RN   MD aware. Will continue to monitor.

## 2013-02-24 NOTE — Progress Notes (Signed)
  Echocardiogram 2D Echocardiogram has been performed.  Casey Moore 02/24/2013, 5:56 PM

## 2013-02-24 NOTE — Progress Notes (Signed)
Foley Note:  Foley huddle done with [employer] Parrish Bonn,R.N. and Cyndia Skeeters..  Per Dr. Titus Mould foley needs to be placed d/t patient unable to urinate, increased renal function and close monitoring of urine output.  Bladder scan indicated >200cc's.  16 Fr. Foley catheter inserted by Rafiq Bucklin,R.N. per protocol.  550cc's s/p foley insertion.  Urine specimen sent.  Foley bag labeled and dated per protocol.  Patient tolerated procedure well.

## 2013-02-24 NOTE — Consult Note (Signed)
PULMONARY  / CRITICAL CARE MEDICINE CONSULTATION  Name: Casey Moore MRN: [id number] DOB: Jan 09, 1932    ADMISSION DATE:  02/23/2013  CHIEF COMPLAINT:  Dyspnea  BRIEF PATIENT DESCRIPTION: 80 year-old male with dementia brought in from the facility by wife for worsening shortness of breath. Admitted to cardiology for presumed cardiac shock in the setting of new atrial fibrillation with RVR. Critical care consulted for assistance in management.  SIGNIFICANT EVENTS / STUDIES:  1. Cardioverted 02/25/2012  LINES / TUBES: 1. Peripheral IVs  CULTURES: 1. Blood cultures 1/24- 2. Urine cultures 1/24- 3. Sputum cultures 1/24- 4. Viral panel 1/24-  ANTIBIOTICS: 1. Vanc 1/24- 2. Cefepime and 1/24- 3. Azithro 1/24- 4. Tamiflu 1/24-  HISTORY OF PRESENT ILLNESS:  Casey Moore is a 80 year old male with dementia likely secondary to Alzheimer's disease who presented to [facility] on 1/23 when his wife noted him to be more short of breath during dinner. The following is obtained from a chart review as his wife is not currently available and he is unable to provide any history. By report, he was noted to be more short of breath while eating today. It is unclear if he had any accompanying of vital sign abnormalities. He reportedly denied any symptoms to the admitting team, however, evening he noted that he is unlikely we'll to reliably provide information. On admission, he was noted to be in atrial fibrillation with rapid ventricular response and he was cardioverted. Pulmonary critical care medicine was consulted for assistance in management of shock.  PAST MEDICAL HISTORY :  Past Medical History  Diagnosis Date  . Osteoarthritis   . Hypertension   . Hx of adenomatous colonic polyps   . Diverticulosis   . Prostate cancer   . Paget's disease   . Kidney stone   . Sinusitis   . SDAT (senile dementia of Alzheimer's type)     Past Surgical History  Procedure Laterality Date  . Cataract  extraction      left-with implant  . Total knee arthroplasty      bilateral  . Mastoid surgery      x 5  . Tonsillectomy and adenoidectomy    . Appendectomy    . Knee surgery      bilateral  . Prostatectomy    . Skin cancer excision      right hip    Prior to Admission medications   Medication Sig Start Date End Date Taking? Authorizing Provider  allopurinol (ZYLOPRIM) 300 MG tablet Take 300 mg by mouth daily.    Yes Historical Provider, MD  atorvastatin (LIPITOR) 20 MG tablet Take 20 mg by mouth daily.    Yes Historical Provider, MD  donepezil (ARICEPT) 10 MG tablet Take 10 mg by mouth daily.   Yes Historical Provider, MD  lisinopril (PRINIVIL,ZESTRIL) 40 MG tablet Take 40 mg by mouth daily.    Yes Historical Provider, MD  LORazepam (ATIVAN) 0.5 MG tablet Take 0.5 mg by mouth 2 (two) times daily as needed for anxiety.   Yes Historical Provider, MD  Multiple Vitamin (MULTIVITAMIN) tablet Take 1 tablet by mouth daily.   Yes Historical Provider, MD  polymixin-bacitracin (POLYSPORIN) 500-10000 UNIT/GM OINT ointment Apply 1 application topically 2 (two) times daily as needed (for skin irritations).   Yes Historical Provider, MD  vitamin C (ASCORBIC ACID) 500 MG tablet Take 500 mg by mouth daily.   Yes Historical Provider, MD    No Known Allergies  FAMILY HISTORY:  Family History  Problem Relation  Age of Onset  . Colon cancer Neg Hx   . Prostate cancer Father   . Dementia Mother   . Throat cancer Paternal Grandfather   . Prostate cancer Brother     SOCIAL HISTORY:  reports that he has never smoked. He has never used smokeless tobacco. He reports that he drinks alcohol. He reports that he does not use illicit drugs.  REVIEW OF SYSTEMS:  Unable to obtain secondary to patient condition.   PHYSICAL EXAM  VITAL SIGNS: Temp:  [97.3 F (36.3 C)-97.9 F (36.6 C)] 97.3 F (36.3 C) (01/24 0000) Pulse Rate:  [48-100] 71 (01/24 0045) Resp:  [19-40] 19 (01/24 0045) BP:  (63-91)/(34-58) 78/49 mmHg (01/24 0045) SpO2:  [95 %-100 %] 100 % (01/24 0045)  HEMODYNAMICS:    VENTILATOR SETTINGS:    INTAKE / OUTPUT: Intake/Output   None     PHYSICAL EXAMINATION: General:  Elderly male in no acute distress Neuro:  responds to name but does not respond to commands. Appears to be moving all show HEENT:  Venturi mask present, mucous membranes moist, oropharynx clear  Neck:  trachea supple in midline, no lymphadenopathy or JVD Cardiovascular:   tachycardic, regular rate rhythm, normal S1-S2 Lungs:   clear to auscultation bilaterally Abdomen:   soft, nontender, nondistended, positive bowel sounds Musculoskeletal:   no clubbing cyanosis or edema Skin:   by report, stage I pressure ulcer posteriorly  LABS:  CBC Recent Labs     02/23/13  2052  WBC  14.3*  HGB  12.4*  HCT  37.5*  PLT  269    Coag's No results found for this basename: APTT, INR,  in the last 72 hours  BMET Recent Labs     02/23/13  2052  NA  148*  K  5.3  CL  112  CO2  13*  BUN  82*  CREATININE  3.60*  GLUCOSE  193*    Electrolytes Recent Labs     02/23/13  2052  CALCIUM  9.7    Sepsis Markers No results found for this basename: LACTICACIDVEN, PROCALCITON, O2SATVEN,  in the last 72 hours  ABG No results found for this basename: PHART, PCO2ART, PO2ART,  in the last 72 hours  Liver Enzymes Recent Labs     02/23/13  2052  AST  106*  ALT  80*  ALKPHOS  130*  BILITOT  0.3  ALBUMIN  2.5*    Cardiac Enzymes Recent Labs     02/23/13  2052  PROBNP  5417.0*    Glucose No results found for this basename: GLUCAP,  in the last 72 hours  Imaging Dg Chest Port 1 View  02/23/2013   CLINICAL DATA:  Shortness of breath.  EXAM: PORTABLE CHEST - 1 VIEW  COMPARISON:  Chest radiograph performed 03/08/2006  FINDINGS: The lungs are hypoexpanded. Left basilar airspace opacity may reflect atelectasis or mild pneumonia. The right lung appears relatively clear. No pleural  effusion or pneumothorax is seen.  The cardiomediastinal silhouette is within normal limits. No acute osseous abnormalities are seen. An external pacing pad is noted.  IMPRESSION: Lungs hypoexpanded. Left basilar airspace opacity may reflect atelectasis or mild pneumonia.   Electronically Signed   By: Garald Balding M.D.   On: 02/23/2013 23:53   ASSESSMENT / PLAN: Active Problems:   Atrial fibrillation    1. Shock: I agree with the admitting team and that Casey Moore shock is most likely secondary to his new onset atrial fibrillation. Unfortunately, it is  difficult to rule out a sepsis with the information available. As such, I think it is prudent to presumptively treat him for sepsis while continuing to evaluate him.  Empiric vancomycin, cefepime, azithromycin  Continue neo; target map of greater than or equal to 65  Serial troponins  Serial lactates  Fluid boluses per cardiology  Can attempt to manage with peripheral IVs, however, it may require central line  2. Acute respiratory failure: This is likely secondary to acute heart failure in the setting of new onset atrial fibrillation. His chest x-ray is notable for possible right-sided pneumonia which could also lead to acute respiratory failure. Given his history, pulmonary embolus is possible as well.  Supplemental oxygen to maintain oxygen saturation greater than or equal to 92%  Check lower chart Dopplers and D-Dimer  3. Atrial fibrillation:  Amiodarone loading per cardiology  4. Acute renal failure: Again, this could be a consequence of acute heart failure. Sepsis is also possible as well.  Serial CMPs  5. Acute hepatitis:  Serial CMPs  6. Hyperglycemia:  Monitor  7. Hypernatremia:   Monitor     I have personally obtained a history, examined the patient, evaluated laboratory and imaging results, formulated the assessment and plan and placed orders.  Margarette Asal, MD Pulmonary and [place] Pager: [phone]  02/24/2013, 1:10 AM

## 2013-02-24 NOTE — Consult Note (Signed)
PULMONARY  / CRITICAL CARE MEDICINE CONSULTATION  Name: MASAMI PLATA MRN: [id number] DOB: [birth year]    ADMISSION DATE:  02/23/2013  CHIEF COMPLAINT:  Dyspnea  BRIEF PATIENT DESCRIPTION: 80 year-old male with dementia brought in from the facility by wife for worsening shortness of breath. Admitted to cardiology for presumed cardiac shock in the setting of new atrial fibrillation with RVR. Critical care consulted for assistance in management.  SIGNIFICANT EVENTS / STUDIES:  1. Cardioverted 02/25/2012  LINES / TUBES: 1. Peripheral IVs  CULTURES: 1. Blood cultures 1/24- 2. Urine cultures 1/24- 3. Sputum cultures 1/24- 4. Viral panel 1/24-  ANTIBIOTICS: 1. Vanc 1/24- 2. Cefepime and 1/24- 3. Azithro 1/24- 4. Tamiflu 1/24-  Subjective:   PHYSICAL EXAM  VITAL SIGNS: Temp:  [97.3 F (36.3 C)-98.3 F (36.8 C)] 98.3 F (36.8 C) (01/24 0800) Pulse Rate:  [27-100] 46 (01/24 0800) Resp:  [18-40] 20 (01/24 0800) BP: (63-113)/(24-80) 103/61 mmHg (01/24 0800) SpO2:  [95 %-100 %] 99 % (01/24 0800) FiO2 (%):  [35 %-50 %] 35 % (01/24 0400) Weight:  [81.3 kg (179 lb 3.7 oz)] 81.3 kg (179 lb 3.7 oz) (01/24 0000)  HEMODYNAMICS:    VENTILATOR SETTINGS: Vent Mode:  [-]  FiO2 (%):  [35 %-50 %] 35 %  INTAKE / OUTPUT: Intake/Output     01/23 0701 - 01/24 0700 01/24 0701 - 01/25 0700   I.V. (mL/kg) 423.1 (5.2) 38.8 (0.5)   IV Piggyback 500    Total Intake(mL/kg) 923.1 (11.4) 38.8 (0.5)   Net +923.1 +38.8          PHYSICAL EXAMINATION: General:  Elderly male in no acute distress Neuro:  responds to name, agitation HEENT:  Venturi mask present on  nose Neck:  No lymphad Cardiovascular:   tachycardic, regular rate rhythm, normal S1-S2 Lungs:   cta Abdomen:   soft, nontender, nondistended, positive bowel sounds Musculoskeletal:   no clubbing cyanosis or edema Skin:   by report, stage I pressure ulcer posteriorly  LABS:  CBC Recent Labs     02/23/13  2052  02/24/13  0350  WBC  14.3*  14.6*  HGB  12.4*  9.9*  HCT  37.5*  30.5*  PLT  269  232    Coag's No results found for this basename: APTT, INR,  in the last 72 hours  BMET Recent Labs     02/23/13  2052  NA  148*  K  5.3  CL  112  CO2  13*  BUN  82*  CREATININE  3.60*  GLUCOSE  193*    Electrolytes Recent Labs     02/23/13  2052  CALCIUM  9.7    Sepsis Markers No results found for this basename: LACTICACIDVEN, PROCALCITON, O2SATVEN,  in the last 72 hours  ABG No results found for this basename: PHART, PCO2ART, PO2ART,  in the last 72 hours  Liver Enzymes Recent Labs     02/23/13  2052  AST  106*  ALT  80*  ALKPHOS  130*  BILITOT  0.3  ALBUMIN  2.5*    Cardiac Enzymes Recent Labs     02/23/13  2052  02/24/13  0352  TROPONINI   --   0.42*  PROBNP  5417.0*   --     Glucose No results found for this basename: GLUCAP,  in the last 72 hours  Imaging Dg Chest Port 1 View  02/23/2013   CLINICAL DATA:  Shortness of breath.  EXAM: PORTABLE CHEST - 1  VIEW  COMPARISON:  Chest radiograph performed 03/08/2006  FINDINGS: The lungs are hypoexpanded. Left basilar airspace opacity may reflect atelectasis or mild pneumonia. The right lung appears relatively clear. No pleural effusion or pneumothorax is seen.  The cardiomediastinal silhouette is within normal limits. No acute osseous abnormalities are seen. An external pacing pad is noted.  IMPRESSION: Lungs hypoexpanded. Left basilar airspace opacity may reflect atelectasis or mild pneumonia.   Electronically Signed   By: Garald Balding M.D.   On: 02/23/2013 23:53   ASSESSMENT / PLAN: Active Problems:   Atrial fibrillation   1. Shock: Unclear etiology, septic vs cardiogenic  Empiric abx  Continue neo; target map of greater than or equal to 60, max at 200 , if at max add vaso  Serial troponins -per cards  Serial lactates reassuring, dc further  Utility of line in question, if renal worsen cvp may be helpful  Ensure  tsh, cortisol done  -pct algo  2. Acute respiratory failure: r/o edema vs developing infiltrate , r/o aspiration  Supplemental oxygen to maintain oxygen saturation greater than or equal to 92%  Check lower chart Dopplers to correlate with d dimer and low clinical suspcion  abg repeat in am , pcxr in am   3. Atrial fibrillation:  Amiodarone loading per cardiology   4. Acute renal failure: r/o atn  Repeat bmet now, ensure UA, renal US, volume to some extent conservative, failure on pcxr?  5. Acute hepatitis: shock liver likely  Serial CMPs, dc tylenal  6. Hyperglycemia:  Monitor ssi  7. Hypernatremia: iatrogenic  Monitor after changing to 1/2 NS  Ccm time 50 min   Lavon Paganini. Titus Mould, MD, [place] Pgr: [place] [facility]

## 2013-02-24 NOTE — Progress Notes (Signed)
ANTICOAGULATION CONSULT NOTE - Follow Up Consult  Pharmacy Consult for Heparin gtt Indication: atrial fibrillation, new onset  No Known Allergies   Labs:  Recent Labs  02/23/13 2052 02/24/13 0350 02/24/13 0352 02/24/13 0730 02/24/13 0800 02/24/13 1430 02/24/13 1915  HGB 12.4* 9.9*  --   --   --   --   --   HCT 37.5* 30.5*  --   --   --   --   --   PLT 269 232  --   --   --   --   --   LABPROT  --   --   --   --  16.9*  --   --   INR  --   --   --   --  1.41  --   --   HEPARINUNFRC  --   --   --   --  0.22*  --  0.26*  CREATININE 3.60*  --   --   --  2.90*  --   --   TROPONINI  --   --  0.42* 0.35*  --  0.30*  --     Estimated Creatinine Clearance: 20.7 ml/min (by C-G formula based on Cr of 2.9).   Medications:  Scheduled:  . aspirin EC  81 mg Oral Daily  . azithromycin  250 mg Intravenous QHS  . ceFEPime (MAXIPIME) IV  1 g Intravenous Q24H  . donepezil  10 mg Oral Daily  . insulin aspart  0-9 Units Subcutaneous Q4H  . pantoprazole (PROTONIX) IV  40 mg Intravenous Q24H  . vancomycin  1,000 mg Intravenous Q48H   Infusions:  . sodium chloride 75 mL/hr at 02/24/13 0955  . heparin 1,200 Units/hr (02/24/13 1635)  . phenylephrine (NEO-SYNEPHRINE) Adult infusion 40 mcg/min (02/24/13 1635)    Assessment: 80 yo M with new onset afib w/RVR s/p successful cardioversion d/t hemodynamic instability. Pharmacy consulted and continuing heparin gtt.    PM HL = 0.26  Goal of Therapy:  Heparin level 0.3-0.7 units/ml Monitor platelets by anticoagulation protocol: Yes   Plan:  - Increase heparin gtt to 1450 units/hr - Follow up AM labs  Thank you Anette Guarneri, PharmD [phone]  02/24/2013 8:07 PM

## 2013-02-24 NOTE — Progress Notes (Signed)
Subjective: Denies CP  Breathing is OK   Objective: Filed Vitals:   02/24/13 0451 02/24/13 0500 02/24/13 0645 02/24/13 0700  BP: 101/56 113/80 108/68 96/52  Pulse: 62 37 27 74  Temp:      TempSrc:      Resp: 23 25 19 19   Height:      Weight:      SpO2: 100% 100% 99% 100%   Weight change:   Intake/Output Summary (Last 24 hours) at 02/24/13 0732 Last data filed at 02/24/13 0700  Gross per 24 hour  Intake 923.12 ml  Output      0 ml  Net 923.12 ml    General: Alert, awake, oriented x3, in no acute distress Neck:  JVP is normal Heart: Regular rate and rhythm, without murmurs, rubs, gallops.  Lungs: Clear to auscultation.  No rales or wheezes. Exemities:  Tr edema  Feet warm  R heel and L toes bandaged   Neuro: Grossly intact, nonfocal.  Tele:  SR with PVCs   Lab Results: Results for orders placed during the hospital encounter of 02/23/13 (from the past 24 hour(s))  CBC WITH DIFFERENTIAL     Status: Abnormal   Collection Time    02/23/13  8:52 PM      Result Value Range   WBC 14.3 (*) 4.0 - 10.5 K/uL   RBC 3.94 (*) 4.22 - 5.81 MIL/uL   Hemoglobin 12.4 (*) 13.0 - 17.0 g/dL   HCT 37.5 (*) 39.0 - 52.0 %   MCV 95.2  78.0 - 100.0 fL   MCH 31.5  26.0 - 34.0 pg   MCHC 33.1  30.0 - 36.0 g/dL   RDW 15.9 (*) 11.5 - 15.5 %   Platelets 269  150 - 400 K/uL   Neutrophils Relative % 90 (*) 43 - 77 %   Lymphocytes Relative 6 (*) 12 - 46 %   Monocytes Relative 4  3 - 12 %   Eosinophils Relative 0  0 - 5 %   Basophils Relative 0  0 - 1 %   Neutro Abs 12.8 (*) 1.7 - 7.7 K/uL   Lymphs Abs 0.9  0.7 - 4.0 K/uL   Monocytes Absolute 0.6  0.1 - 1.0 K/uL   Eosinophils Absolute 0.0  0.0 - 0.7 K/uL   Basophils Absolute 0.0  0.0 - 0.1 K/uL   Smear Review MORPHOLOGY UNREMARKABLE    COMPREHENSIVE METABOLIC PANEL     Status: Abnormal   Collection Time    02/23/13  8:52 PM      Result Value Range   Sodium 148 (*) 137 - 147 mEq/L   Potassium 5.3  3.7 - 5.3 mEq/L   Chloride 112  96 - 112  mEq/L   CO2 13 (*) 19 - 32 mEq/L   Glucose, Bld 193 (*) 70 - 99 mg/dL   BUN 82 (*) 6 - 23 mg/dL   Creatinine, Ser 3.60 (*) 0.50 - 1.35 mg/dL   Calcium 9.7  8.4 - 10.5 mg/dL   Total Protein 7.1  6.0 - 8.3 g/dL   Albumin 2.5 (*) 3.5 - 5.2 g/dL   AST 106 (*) 0 - 37 U/L   ALT 80 (*) 0 - 53 U/L   Alkaline Phosphatase 130 (*) 39 - 117 U/L   Total Bilirubin 0.3  0.3 - 1.2 mg/dL   GFR calc non Af Amer 15 (*) >90 mL/min   GFR calc Af Amer 17 (*) >90 mL/min  PRO B NATRIURETIC PEPTIDE  Status: Abnormal   Collection Time    02/23/13  8:52 PM      Result Value Range   Pro B Natriuretic peptide (BNP) 5417.0 (*) 0 - 450 pg/mL  POCT I-STAT TROPONIN I     Status: Abnormal   Collection Time    02/23/13  9:17 PM      Result Value Range   Troponin i, poc 0.59 (*) 0.00 - 0.08 ng/mL   Comment NOTIFIED PHYSICIAN     Comment 3           CG4 I-STAT (LACTIC ACID)     Status: Abnormal   Collection Time    02/23/13  9:20 PM      Result Value Range   Lactic Acid, Venous 3.58 (*) 0.5 - 2.2 mmol/L  MRSA PCR SCREENING     Status: None   Collection Time    02/24/13 12:12 AM      Result Value Range   MRSA by PCR NEGATIVE  NEGATIVE  CBC     Status: Abnormal   Collection Time    02/24/13  3:50 AM      Result Value Range   WBC 14.6 (*) 4.0 - 10.5 K/uL   RBC 3.19 (*) 4.22 - 5.81 MIL/uL   Hemoglobin 9.9 (*) 13.0 - 17.0 g/dL   HCT 30.5 (*) 39.0 - 52.0 %   MCV 95.6  78.0 - 100.0 fL   MCH 31.0  26.0 - 34.0 pg   MCHC 32.5  30.0 - 36.0 g/dL   RDW 15.8 (*) 11.5 - 15.5 %   Platelets 232  150 - 400 K/uL  LACTIC ACID, PLASMA     Status: None   Collection Time    02/24/13  3:50 AM      Result Value Range   Lactic Acid, Venous 1.9  0.5 - 2.2 mmol/L  D-DIMER, QUANTITATIVE     Status: Abnormal   Collection Time    02/24/13  3:50 AM      Result Value Range   D-Dimer, Quant 9.74 (*) 0.00 - 0.48 ug/mL-FEU  TROPONIN I     Status: Abnormal   Collection Time    02/24/13  3:52 AM      Result Value Range    Troponin I 0.42 (*) <0.30 ng/mL    Studies/Results: @RISRSLT24 @  Medications:    @PROBHOSP @  1.  Atrial fib  S/p cardioversion.  Maintaining SR.  Amiodarone loading.  On heparin  2.  Shock  Patient being treating empirically for infection  Still requiring pressor support    3.  CHF  Echo pending.  Follow I/Os  D Dimer LE dopplers ordered.  On IV heparin   4.  Renal  Cr signif higher today.  Follow  5.  Increased LFTs. Follow trends  May be related to shock  5.  [place].  LOS: 1 day   Dorris Carnes 02/24/2013, 7:32 AM

## 2013-02-24 NOTE — Progress Notes (Signed)
ANTICOAGULATION CONSULT NOTE - Follow Up Consult  Pharmacy Consult for Heparin gtt Indication: atrial fibrillation, new onset  No Known Allergies  Patient Measurements: Height: 5\' 9"  (175.3 cm) Weight: 179 lb 3.7 oz (81.3 kg) IBW/kg (Calculated) : 70.7 Heparin Dosing Weight: 81.3 kg (actual body wt)  Vital Signs: Temp: 98.3 F (36.8 C) (01/24 0800) Temp src: Oral (01/24 0800) BP: 103/61 mmHg (01/24 0800) Pulse Rate: 46 (01/24 0800)  Labs:  Recent Labs  02/23/13 2052 02/24/13 0350 02/24/13 0352 02/24/13 0730 02/24/13 0800  HGB 12.4* 9.9*  --   --   --   HCT 37.5* 30.5*  --   --   --   PLT 269 232  --   --   --   LABPROT  --   --   --   --  16.9*  INR  --   --   --   --  1.41  HEPARINUNFRC  --   --   --   --  0.22*  CREATININE 3.60*  --   --   --   --   TROPONINI  --   --  0.42* 0.35*  --     Estimated Creatinine Clearance: 16.6 ml/min (by C-G formula based on Cr of 3.6).   Medications:  Scheduled:  . aspirin EC  81 mg Oral Daily  . azithromycin  250 mg Intravenous QHS  . ceFEPime (MAXIPIME) IV  1 g Intravenous Q24H  . donepezil  10 mg Oral Daily  . insulin aspart  0-9 Units Subcutaneous Q4H  . midazolam      . pantoprazole (PROTONIX) IV  40 mg Intravenous Q24H  . vancomycin  1,000 mg Intravenous Q48H   Infusions:  . sodium chloride 75 mL/hr at 02/24/13 0955  . heparin 1,000 Units/hr (02/24/13 0031)  . phenylephrine (NEO-SYNEPHRINE) Adult infusion 50 mcg/min (02/24/13 2992)    Assessment: 80 yo M with new onset afib w/RVR s/p successful cardioversion d/t hemodynamic instability. Pharmacy consulted and continuing heparin gtt.  First HL reports back subtherapeutic at 0.22.  Patient has renal dysfunction with SCr improving to 2.9 with estimated CrCl ~17.  H/H is low and trending down, and plt are wnl.  No reports of line issues or bleeding noted.    Goal of Therapy:  Heparin level 0.3-0.7 units/ml Monitor platelets by anticoagulation protocol: Yes   Plan:   - increase heparin gtt to 1200 units/hr - draw 8h heparin level - daily HL, CBC - monitor trend in H/H - monitor for s/s of bleeding - f/u anticoagulation plans  Ovid Curd E. Jacqlyn Larsen, PharmD Clinical Pharmacist - Resident Pager: [phone] Pharmacy: [phone] 02/24/2013 10:02 AM

## 2013-02-25 ENCOUNTER — Inpatient Hospital Stay (HOSPITAL_COMMUNITY): Payer: 59

## 2013-02-25 DIAGNOSIS — D649 Anemia, unspecified: Secondary | ICD-10-CM

## 2013-02-25 DIAGNOSIS — F0390 Unspecified dementia without behavioral disturbance: Secondary | ICD-10-CM

## 2013-02-25 DIAGNOSIS — I255 Ischemic cardiomyopathy: Secondary | ICD-10-CM | POA: Diagnosis present

## 2013-02-25 DIAGNOSIS — I2589 Other forms of chronic ischemic heart disease: Secondary | ICD-10-CM

## 2013-02-25 DIAGNOSIS — R0602 Shortness of breath: Secondary | ICD-10-CM

## 2013-02-25 LAB — CBC WITH DIFFERENTIAL/PLATELET
BASOS PCT: 0 % (ref 0–1)
Basophils Absolute: 0 10*3/uL (ref 0.0–0.1)
Eosinophils Absolute: 0.2 10*3/uL (ref 0.0–0.7)
Eosinophils Relative: 2 % (ref 0–5)
HCT: 22.6 % — ABNORMAL LOW (ref 39.0–52.0)
Hemoglobin: 7.5 g/dL — ABNORMAL LOW (ref 13.0–17.0)
Lymphocytes Relative: 7 % — ABNORMAL LOW (ref 12–46)
Lymphs Abs: 0.8 10*3/uL (ref 0.7–4.0)
MCH: 31.5 pg (ref 26.0–34.0)
MCHC: 33.2 g/dL (ref 30.0–36.0)
MCV: 95 fL (ref 78.0–100.0)
MONO ABS: 0.6 10*3/uL (ref 0.1–1.0)
Monocytes Relative: 6 % (ref 3–12)
NEUTROS ABS: 8.7 10*3/uL — AB (ref 1.7–7.7)
Neutrophils Relative %: 85 % — ABNORMAL HIGH (ref 43–77)
PLATELETS: 189 10*3/uL (ref 150–400)
RBC: 2.38 MIL/uL — ABNORMAL LOW (ref 4.22–5.81)
RDW: 15.6 % — ABNORMAL HIGH (ref 11.5–15.5)
WBC: 10.2 10*3/uL (ref 4.0–10.5)

## 2013-02-25 LAB — PROCALCITONIN: Procalcitonin: 0.46 ng/mL

## 2013-02-25 LAB — COMPREHENSIVE METABOLIC PANEL
ALBUMIN: 1.8 g/dL — AB (ref 3.5–5.2)
ALK PHOS: 93 U/L (ref 39–117)
ALT: 81 U/L — AB (ref 0–53)
AST: 83 U/L — ABNORMAL HIGH (ref 0–37)
BUN: 65 mg/dL — ABNORMAL HIGH (ref 6–23)
CALCIUM: 8.1 mg/dL — AB (ref 8.4–10.5)
CO2: 16 mEq/L — ABNORMAL LOW (ref 19–32)
CREATININE: 2.08 mg/dL — AB (ref 0.50–1.35)
Chloride: 112 mEq/L (ref 96–112)
GFR, EST AFRICAN AMERICAN: 33 mL/min — AB (ref >90)
GFR, EST NON AFRICAN AMERICAN: 29 mL/min — AB (ref >90)
Glucose, Bld: 106 mg/dL — ABNORMAL HIGH (ref 70–99)
Potassium: 4 mEq/L (ref 3.7–5.3)
Sodium: 142 mEq/L (ref 137–147)
TOTAL PROTEIN: 5.1 g/dL — AB (ref 6.0–8.3)
Total Bilirubin: 0.2 mg/dL — ABNORMAL LOW (ref 0.3–1.2)

## 2013-02-25 LAB — URINE CULTURE
CULTURE: NO GROWTH
Colony Count: NO GROWTH
Special Requests: NORMAL

## 2013-02-25 LAB — TSH: TSH: 0.305 u[IU]/mL — ABNORMAL LOW (ref 0.350–4.500)

## 2013-02-25 LAB — T3, FREE: T3, Free: 2.2 pg/mL — ABNORMAL LOW (ref 2.3–4.2)

## 2013-02-25 LAB — BLOOD GAS, ARTERIAL
Acid-base deficit: 7.8 mmol/L — ABNORMAL HIGH (ref 0.0–2.0)
Bicarbonate: 16.1 mEq/L — ABNORMAL LOW (ref 20.0–24.0)
Drawn by: 33234
O2 Content: 3 L/min
O2 Saturation: 99 %
PATIENT TEMPERATURE: 98.6
TCO2: 16.9 mmol/L (ref 0–100)
pCO2 arterial: 26.4 mmHg — ABNORMAL LOW (ref 35.0–45.0)
pH, Arterial: 7.401 (ref 7.350–7.450)
pO2, Arterial: 130 mmHg — ABNORMAL HIGH (ref 80.0–100.0)

## 2013-02-25 LAB — CBC
HEMATOCRIT: 23.3 % — AB (ref 39.0–52.0)
Hemoglobin: 7.7 g/dL — ABNORMAL LOW (ref 13.0–17.0)
MCH: 31 pg (ref 26.0–34.0)
MCHC: 33 g/dL (ref 30.0–36.0)
MCV: 94 fL (ref 78.0–100.0)
PLATELETS: 180 10*3/uL (ref 150–400)
RBC: 2.48 MIL/uL — AB (ref 4.22–5.81)
RDW: 15.3 % (ref 11.5–15.5)
WBC: 6.8 10*3/uL (ref 4.0–10.5)

## 2013-02-25 LAB — PHOSPHORUS: Phosphorus: 3.2 mg/dL (ref 2.3–4.6)

## 2013-02-25 LAB — BASIC METABOLIC PANEL
BUN: 56 mg/dL — AB (ref 6–23)
CALCIUM: 7.6 mg/dL — AB (ref 8.4–10.5)
CO2: 16 mEq/L — ABNORMAL LOW (ref 19–32)
CREATININE: 1.61 mg/dL — AB (ref 0.50–1.35)
Chloride: 105 mEq/L (ref 96–112)
GFR, EST AFRICAN AMERICAN: 45 mL/min — AB (ref >90)
GFR, EST NON AFRICAN AMERICAN: 39 mL/min — AB (ref >90)
Glucose, Bld: 320 mg/dL — ABNORMAL HIGH (ref 70–99)
POTASSIUM: 3.6 meq/L — AB (ref 3.7–5.3)
Sodium: 138 mEq/L (ref 137–147)

## 2013-02-25 LAB — HEPARIN LEVEL (UNFRACTIONATED): HEPARIN UNFRACTIONATED: 0.38 [IU]/mL (ref 0.30–0.70)

## 2013-02-25 LAB — GLUCOSE, CAPILLARY
GLUCOSE-CAPILLARY: 75 mg/dL (ref 70–99)
GLUCOSE-CAPILLARY: 72 mg/dL (ref 70–99)
GLUCOSE-CAPILLARY: 298 mg/dL — AB (ref 70–99)
Glucose-Capillary: 97 mg/dL (ref 70–99)
Glucose-Capillary: 76 mg/dL (ref 70–99)

## 2013-02-25 LAB — MAGNESIUM: Magnesium: 1.6 mg/dL (ref 1.5–2.5)

## 2013-02-25 MED ORDER — MAGNESIUM SULFATE 40 MG/ML IJ SOLN
INTRAMUSCULAR | Status: AC
  Filled 2013-02-25: qty 50

## 2013-02-25 MED ORDER — AMIODARONE HCL IN DEXTROSE 360-4.14 MG/200ML-% IV SOLN
30.0000 mg/h | INTRAVENOUS | Status: DC
  Administered 2013-02-25 – 2013-02-26 (×3): 30 mg/h via INTRAVENOUS
  Filled 2013-02-25 (×7): qty 200

## 2013-02-25 MED ORDER — AMIODARONE HCL IN DEXTROSE 360-4.14 MG/200ML-% IV SOLN
60.0000 mg/h | INTRAVENOUS | Status: AC
Start: 2013-02-25 — End: 2013-02-25
  Administered 2013-02-25: 60 mg/h via INTRAVENOUS

## 2013-02-25 MED ORDER — SODIUM BICARBONATE 8.4 % IV SOLN
INTRAVENOUS | Status: AC
  Administered 2013-02-25: 11:00:00 via INTRAVENOUS
  Filled 2013-02-25: qty 150

## 2013-02-25 MED ORDER — LOPERAMIDE HCL 2 MG PO CAPS
4.0000 mg | ORAL_CAPSULE | ORAL | Status: DC | PRN
  Administered 2013-02-25: 4 mg via ORAL
  Filled 2013-02-25: qty 2

## 2013-02-25 MED ORDER — AMIODARONE HCL 200 MG PO TABS
200.0000 mg | ORAL_TABLET | Freq: Two times a day (BID) | ORAL | Status: DC
  Administered 2013-02-25: 200 mg via ORAL
  Filled 2013-02-25 (×2): qty 1

## 2013-02-25 MED ORDER — MAGNESIUM SULFATE 40 MG/ML IJ SOLN
2.0000 g | Freq: Once | INTRAMUSCULAR | Status: AC
  Administered 2013-02-25: 2 g via INTRAVENOUS

## 2013-02-25 MED ORDER — ONDANSETRON HCL 4 MG PO TABS: 4.0000 mg | ORAL_TABLET | Freq: Three times a day (TID) | ORAL | Status: DC | PRN

## 2013-02-25 MED ORDER — AZITHROMYCIN 250 MG PO TABS
250.0000 mg | ORAL_TABLET | Freq: Every day | ORAL | Status: DC
  Administered 2013-02-25 – 2013-02-28 (×4): 250 mg via ORAL
  Filled 2013-02-25 (×4): qty 1

## 2013-02-25 MED ORDER — AMIODARONE HCL IN DEXTROSE 360-4.14 MG/200ML-% IV SOLN
INTRAVENOUS | Status: AC
  Filled 2013-02-25: qty 200

## 2013-02-25 NOTE — Progress Notes (Addendum)
Patient Name: Casey Moore Date of Encounter: 02/25/2013  Active Problems:   Atrial fibrillation   Length of Stay: 2  SUBJECTIVE  The patient has advanced Alzheimer. He denies chest pain or SOB. He is surrounded by his wife and daughters who are very supportive.  CURRENT MEDS . aspirin EC  81 mg Oral Daily  . azithromycin  250 mg Oral Daily  . donepezil  10 mg Oral Daily  . insulin aspart  0-9 Units Subcutaneous Q4H   . sodium chloride 10 mL/hr at 02/25/13 0907  . heparin 1,450 Units/hr (02/24/13 2115)  .  sodium bicarbonate  infusion 1000 mL 100 mL/hr at 02/25/13 1113   Amiodarone drip Phenylephrine drip  OBJECTIVE  Filed Vitals:   02/25/13 0731 02/25/13 0800 02/25/13 0900 02/25/13 1000  BP: 107/69 107/67 97/48 79/45   Pulse: 68 50 57 68  Temp:  97.6 F (36.4 C)    TempSrc:  Oral    Resp: 14 19 26 20   Height:      Weight:      SpO2: 100% 100% 100% 100%    Intake/Output Summary (Last 24 hours) at 02/25/13 1123 Last data filed at 02/25/13 1000  Gross per 24 hour  Intake 3013.52 ml  Output   1760 ml  Net 1253.52 ml   Filed Weights   02/24/13 0000 02/25/13 0400  Weight: 179 lb 3.7 oz (81.3 kg) 186 lb 1.1 oz (84.4 kg)    PHYSICAL EXAM  General: Alert, awake, oriented x3, in no acute distress  Neck: JVP is normal  Heart: Regular rate and rhythm, without murmurs, rubs, gallops.  Lungs: Clear to auscultation. No rales or wheezes.  Exemities: Tr edema Feet warm R heel and L toes bandaged  Neuro: Grossly intact, nonfocal.  Accessory Clinical Findings  CBC  Recent Labs  02/23/13 2052 02/24/13 0350 02/25/13 0337  WBC 14.3* 14.6* 10.2  NEUTROABS 12.8*  --  8.7*  HGB 12.4* 9.9* 7.5*  HCT 37.5* 30.5* 22.6*  MCV 95.2 95.6 95.0  PLT 269 232 606   Basic Metabolic Panel  Recent Labs  02/24/13 0800 02/25/13 0337  NA 151* 142  K 4.1 4.0  CL 118* 112  CO2 17* 16*  GLUCOSE 106* 106*  BUN 80* 65*  CREATININE 2.90* 2.08*  CALCIUM 8.4 8.1*    Liver Function Tests  Recent Labs  02/24/13 0800 02/25/13 0337  AST 86* 83*  ALT 78* 81*  ALKPHOS 101 93  BILITOT 0.2* 0.2*  PROT 5.6* 5.1*  ALBUMIN 2.1* 1.8*   Cardiac Enzymes  Recent Labs  02/24/13 0352 02/24/13 0730 02/24/13 1430  TROPONINI 0.42* 0.35* 0.30*   D-Dimer  Recent Labs  02/24/13 0350  DDIMER 9.74*    Recent Labs  02/23/13 2315  TSH 0.305*    Radiology/Studies  Dg Chest Port 1 View  02/25/2013   CLINICAL DATA:  Atrial fibrillation. Followup left lower lobe opacity.  EXAM: PORTABLE CHEST - 1 VIEW  COMPARISON:  02/23/2013  FINDINGS: Left lung base suboptimally visualized due to position of overlying defibrillator pad. Mild left retrocardiac opacity shows no definite change, and may be due to atelectasis or infiltrate. Right lung is clear. Heart size is normal.  IMPRESSION: Technically limited exam. No definite change in mild left basilar atelectasis versus infiltrate.      TELE; very frequent PACs, back to a-fib at 10 am today, ns VT - 5 beats  Echo: 02/24/2013  - Left ventricle: The cavity size was normal. Wall  thickness was normal. Systolic function was moderately reduced. The estimated ejection fraction was in the range of 35% to 40%. There is hypokinesis of the mid-distalinferior myocardium. There is akinesis of the basalinferior myocardium. The study is not technically sufficient to allow evaluation of LV diastolic function. - Aortic valve: Mildly to moderately calcified annulus. Trileaflet; mildly calcified leaflets. Mild regurgitation. - Mitral valve: Calcified annulus. Mild regurgitation. - Left atrium: The atrium was moderately dilated. - Right ventricle: The cavity size was mildly dilated. Systolic function was mildly to moderately reduced. - Right atrium: The atrium was moderately dilated. There was the appearance of a Chiari network. - Tricuspid valve: Mild-moderate regurgitation. Peak RV-RA gradient: 30mm Hg (S). - Inferior  vena cava: Not visualized. Unable to estimate CVP. - Pericardium, extracardiac: There was no pericardial effusion. Impressions:  - Very limited images with substantial shadowing due to air interference. :LV size is normal with roughly estimated LVEF 35-40%, possible inferior hypokinesis - wall motion difficult to assess. Indeterminate diastolic function. Moderate biatrial enlargement. MAC with mild mitral regurgitation. Mild aortic regurgitation. Mild RV dilatation with reduced contraction. Mild to moderate tricuspid regurgitation. Unable to assess PASP. The RV-RA gradient is 26 mmHg (with a normal or low CVP, the PASP would be in normal range). Could consider a followup limited contrast study when patient more stable clinically to better evaluate LV function.   ASSESSMENT AND PLAN  1.  Atrial fib  S/p cardioversion.  Very frequent ectopy, went back to a-fib around 10 am today and cardioverted back to SR about an hour later. IV Amiodarone was discontinued on Friday, we will restart PO. On heparin.  Low TSH, we will check free T3 and T4.  2. nsVT - continue amiodarone PO  3. NSTEMI - LVEF 35-40% with regional awll motion abnormalities, RV EF moderately decreased, considering AKI and the degree of dementia we will treat medically  4.  Shock  - appears to be rather septic than cardiogenic, on phenylephrine, still 20 mcg, difficult to wean off, on ATB for presumed pneumonia  5.  Cardiomyopathy - LVEF 35-40 %, appears to be ischemic but possible combination with tachycardia induced, not able to add ACEI considering hypotension and AKI  6. LLE DVT - acute, on heparin drip, possibly PE considering the degree of RV dysfunction, won't proceed additional work up given AKI and that the management won't change   7. AKI - slightly improved  8.  Increased LFTs. Follow trends  May be related to shock  9.  [place] - the patient lives in a nursing home - memory unit.    LOS: 2 days    Signed, Ena Dawley, H MD, [facility] 02/25/2013

## 2013-02-25 NOTE — Progress Notes (Signed)
PULMONARY  / CRITICAL CARE MEDICINE CONSULTATION  Name: Casey Moore MRN: [id number] DOB: September 01, 1931    ADMISSION DATE:  02/23/2013  CHIEF COMPLAINT:  Dyspnea  BRIEF PATIENT DESCRIPTION: 80 year-old male with dementia brought in from the facility by wife for worsening shortness of breath. Admitted to cardiology for presumed cardiac shock in the setting of new atrial fibrillation with RVR. Critical care consulted for assistance in management.  SIGNIFICANT EVENTS / STUDIES:  1. Cardioverted 02/25/2012  LINES / TUBES: 1. Peripheral IVs  CULTURES: 1. Blood cultures 1/24- 2. Urine cultures 1/24- 3. Sputum cultures 1/24- 4. Viral panel 1/24-  ANTIBIOTICS: 1. Vanc 1/24-1/25 2. Cefepime and 1/24-1/25 3. Azithro 1/24- 4. Tamiflu 1/24-1/24  Subjective: low dose neo remains, no distress  PHYSICAL EXAM  VITAL SIGNS: Temp:  [97.5 F (36.4 C)-98.9 F (37.2 C)] 97.6 F (36.4 C) (01/25 0800) Pulse Rate:  [44-129] 50 (01/25 0800) Resp:  [14-29] 19 (01/25 0800) BP: (63-152)/(31-79) 107/67 mmHg (01/25 0800) SpO2:  [72 %-100 %] 100 % (01/25 0800) Weight:  [84.4 kg (186 lb 1.1 oz)] 84.4 kg (186 lb 1.1 oz) (01/25 0400)  HEMODYNAMICS:    VENTILATOR SETTINGS:    INTAKE / OUTPUT: Intake/Output     01/24 0701 - 01/25 0700 01/25 0701 - 01/26 0700   P.O. 420 125   I.V. (mL/kg) 2354.7 (27.9) 75 (0.9)   IV Piggyback 175    Total Intake(mL/kg) 2949.7 (34.9) 200 (2.4)   Urine (mL/kg/hr) 1575 (0.8) 185 (1.1)   Total Output 1575 185   Net +1374.7 +15        Stool Occurrence 2 x      PHYSICAL EXAMINATION: General:  Elderly male in no acute distress Neuro:  responds to name, agitation no longer, much more alert and cooperative HEENT:  jvd wnl Neck:  No lymphad Cardiovascular:  s1 s2 irr Lungs:   cta Abdomen:   soft, nontender, nondistended, positive bowel sounds Musculoskeletal:   No edema  LABS:  CBC Recent Labs     02/23/13  2052  02/24/13  0350  02/25/13  0337  WBC   14.3*  14.6*  10.2  HGB  12.4*  9.9*  7.5*  HCT  37.5*  30.5*  22.6*  PLT  269  232  189    Coag's Recent Labs     02/24/13  0800  INR  1.41    BMET Recent Labs     02/23/13  2052  02/24/13  0800  02/25/13  0337  NA  148*  151*  142  K  5.3  4.1  4.0  CL  112  118*  112  CO2  13*  17*  16*  BUN  82*  80*  65*  CREATININE  3.60*  2.90*  2.08*  GLUCOSE  193*  106*  106*    Electrolytes Recent Labs     02/23/13  2052  02/24/13  0800  02/25/13  0337  CALCIUM  9.7  8.4  8.1*    Sepsis Markers Recent Labs     02/24/13  0914  02/25/13  0337  PROCALCITON  0.63  0.46    ABG Recent Labs     02/25/13  0450  PHART  7.401  PCO2ART  26.4*  PO2ART  130.0*    Liver Enzymes Recent Labs     02/23/13  2052  02/24/13  0800  02/25/13  0337  AST  106*  86*  83*  ALT  80*  78*  81*  ALKPHOS  130*  101  93  BILITOT  0.3  0.2*  0.2*  ALBUMIN  2.5*  2.1*  1.8*    Cardiac Enzymes Recent Labs     02/23/13  2052  02/24/13  0352  02/24/13  0730  02/24/13  1430  TROPONINI   --   0.42*  0.35*  0.30*  PROBNP  5417.0*   --    --    --     Glucose Recent Labs     02/24/13  1130  02/24/13  1559  02/24/13  2114  02/24/13  2332  02/25/13  0336  02/25/13  0749  GLUCAP  82  98  87  96  97  75    Imaging US Renal  02/24/2013   CLINICAL DATA:  Acute renal insufficiency. Hypertension. Prostate carcinoma.  EXAM: RENAL/URINARY TRACT ULTRASOUND COMPLETE  COMPARISON:  12/09/2008  FINDINGS: Right Kidney:  Length: 11.3 cm. Increased renal parenchymal echogenicity noted. Small cysts measuring approximately 2 cm are seen in the upper and lower poles. No mass or hydronephrosis visualized.  Left Kidney:  Length: 11.9 cm. Increased renal parenchymal echogenicity noted. A subcapsular cyst is seen in the upper pole measuring 2.7 cm. No mass or hydronephrosis visualized.  Bladder:  Mildly distended.  Appears normal for degree of bladder distention.  IMPRESSION: Normal size kidneys  with increased renal parenchymal echogenicity, consistent with medical renal disease. No evidence of hydronephrosis.  Mildly distended urinary bladder.   Electronically Signed   By: Earle Gell M.D.   On: 02/24/2013 18:49   Dg Chest Port 1 View  02/23/2013   CLINICAL DATA:  Shortness of breath.  EXAM: PORTABLE CHEST - 1 VIEW  COMPARISON:  Chest radiograph performed 03/08/2006  FINDINGS: The lungs are hypoexpanded. Left basilar airspace opacity may reflect atelectasis or mild pneumonia. The right lung appears relatively clear. No pleural effusion or pneumothorax is seen.  The cardiomediastinal silhouette is within normal limits. No acute osseous abnormalities are seen. An external pacing pad is noted.  IMPRESSION: Lungs hypoexpanded. Left basilar airspace opacity may reflect atelectasis or mild pneumonia.   Electronically Signed   By: Garald Balding M.D.   On: 02/23/2013 23:53   ASSESSMENT / PLAN: Active Problems:   Atrial fibrillation   1. Shock: Unclear etiology, septic vs cardiogenic (favor), resolving rapid  Empiric abx unlikely required with such rapid reoslution  Continue neo; likely to dc today, if map goals met  tsh 0.3, assess t3, t4  -pct algo re assuring  Correct NON AG in setting of ARF, bicarb x 2 liter  2. Acute respiratory failure:  r/o aspiration- resolved  Supplemental oxygen to maintain oxygen saturation greater than or equal to 92% - on RA  Dopplers pending to correlate d dimer, low clinical suspcion  Correct NON AG with bicarb  3. Atrial fibrillation:  Amiodarone loading per cardiology   4. Acute renal failure: r/o atn, NON AG acidosis mostly from saline prior  Bicarb x 1 liter then kvo 1/2 NS  Chem in am   5. Acute hepatitis: shock liver likely  Serial CMP improved, chem in am   Diet tolerated well  6. Hyperglycemia contrlled  Monitor ssi  7. Hypernatremia: iatrogenic  Monitor with na bicarb started today  R/o  Dysphagia slp  Anemia Dilution liely  Cbc in pm  May need to hold heparin  Ccm time 30 min   Lavon Paganini. Titus Mould, MD, [place] Pgr: [facility]

## 2013-02-25 NOTE — Progress Notes (Signed)
[facility] Progress Note Patient Name: Casey Moore DOB: 24-Apr-1931 MRN: [id number]  Date of Service  02/25/2013   HPI/Events of Note   Hypomagnesemia Cardiac arrhythmia  eICU Interventions  Replete magnesium   Intervention Category Minor Interventions: Electrolytes abnormality - evaluation and management  MCQUAID, DOUGLAS 02/25/2013, 6:48 PM

## 2013-02-25 NOTE — Progress Notes (Signed)
Patient sleeping soundly, will change B/P jto q. 5 minutes to monitor

## 2013-02-25 NOTE — Progress Notes (Signed)
ANTICOAGULATION CONSULT NOTE - Follow Up Consult  Pharmacy Consult for Heparin gtt Indication: atrial fibrillation, new onset  No Known Allergies  Patient Measurements: Height: 5\' 9"  (175.3 cm) Weight: 186 lb 1.1 oz (84.4 kg) IBW/kg (Calculated) : 70.7 Heparin Dosing Weight: 81.3 kg (actual body wt)  Vital Signs: Temp: 97.6 F (36.4 C) (01/25 0800) Temp src: Oral (01/25 0800) BP: 99/55 mmHg (01/25 1500) Pulse Rate: 74 (01/25 1500)  Labs:  Recent Labs  02/23/13 2052 02/24/13 0350 02/24/13 0352 02/24/13 0730 02/24/13 0800 02/24/13 1430 02/24/13 1915 02/25/13 0337  HGB 12.4* 9.9*  --   --   --   --   --  7.5*  HCT 37.5* 30.5*  --   --   --   --   --  22.6*  PLT 269 232  --   --   --   --   --  189  LABPROT  --   --   --   --  16.9*  --   --   --   INR  --   --   --   --  1.41  --   --   --   HEPARINUNFRC  --   --   --   --  0.22*  --  0.26* 0.38  CREATININE 3.60*  --   --   --  2.90*  --   --  2.08*  TROPONINI  --   --  0.42* 0.35*  --  0.30*  --   --     Estimated Creatinine Clearance: 28.8 ml/min (by C-G formula based on Cr of 2.08).   Medications:  Scheduled:  . amiodarone  200 mg Oral BID  . aspirin EC  81 mg Oral Daily  . azithromycin  250 mg Oral Daily  . donepezil  10 mg Oral Daily  . insulin aspart  0-9 Units Subcutaneous Q4H   Infusions:  . sodium chloride 10 mL/hr at 02/25/13 0907  . heparin 1,450 Units/hr (02/25/13 1320)  .  sodium bicarbonate  infusion 1000 mL 100 mL/hr at 02/25/13 1113    Assessment: 80 yo M with new onset afib w/RVR s/p successful cardioversion d/t hemodynamic instability, and went in and out today. Pharmacy consulted and continuing heparin gtt.  Heparin gtt rate was increased last night to 1450 units/hr d/t suptherapeutic levels.  This morning's HL has resulted therapeutic at 0.38. Patient has renal dysfunction with SCr further improving to 2.08 with estimated CrCl ~29.  H/H is low and continues to trend down, and plt also  trending downward but still wnl (fluids +2.7L since admission).  No reports of line issues or bleeding noted.    Goal of Therapy:  Heparin level 0.3-0.7 units/ml Monitor platelets by anticoagulation protocol: Yes   Plan:  - continue heparin gtt at 1450 units/hr - f/u HL with AM labs - daily HL, CBC - closely monitor trend in H/H - monitor for s/s of bleeding - f/u anticoagulation plans  Ovid Curd E. Jacqlyn Larsen, PharmD Clinical Pharmacist - Resident Pager: [phone] Pharmacy: [phone] 02/25/2013 3:11 PM

## 2013-02-25 NOTE — Progress Notes (Signed)
6 second run of Torsades on the monitor.  Pt alert and responsive, no chest pain, BP stable.  Notified Dr. Meda Coffee with cards and Dr. Titus Mould with CCM.  Order received to begin Amiodarone infusion, along with STAT BMET, Mag, and Phos.  Pt currently in Afib with frequent PACs.  Continuing to monitor.

## 2013-02-25 NOTE — Progress Notes (Signed)
Bilateral lower extremity venous duplex completed.  Right:  DVT noted popliteal, posterior tibial, and peroneal veins.  No evidence of superficial thrombosis.  No Baker's cyst.  Left:  No evidence of DVT, superficial thrombosis, or Baker's cyst.

## 2013-02-26 DIAGNOSIS — R7989 Other specified abnormal findings of blood chemistry: Secondary | ICD-10-CM

## 2013-02-26 DIAGNOSIS — R799 Abnormal finding of blood chemistry, unspecified: Secondary | ICD-10-CM

## 2013-02-26 DIAGNOSIS — I472 Ventricular tachycardia: Secondary | ICD-10-CM

## 2013-02-26 DIAGNOSIS — Z9181 History of falling: Secondary | ICD-10-CM

## 2013-02-26 DIAGNOSIS — F02818 Dementia in other diseases classified elsewhere, unspecified severity, with other behavioral disturbance: Secondary | ICD-10-CM

## 2013-02-26 DIAGNOSIS — F0280 Dementia in other diseases classified elsewhere without behavioral disturbance: Secondary | ICD-10-CM

## 2013-02-26 DIAGNOSIS — I824Z1 Acute embolism and thrombosis of unspecified deep veins of right distal lower extremity: Secondary | ICD-10-CM | POA: Diagnosis present

## 2013-02-26 DIAGNOSIS — F0281 Dementia in other diseases classified elsewhere with behavioral disturbance: Secondary | ICD-10-CM

## 2013-02-26 DIAGNOSIS — I4721 Torsades de pointes: Secondary | ICD-10-CM | POA: Diagnosis present

## 2013-02-26 DIAGNOSIS — I824Z9 Acute embolism and thrombosis of unspecified deep veins of unspecified distal lower extremity: Secondary | ICD-10-CM

## 2013-02-26 DIAGNOSIS — G309 Alzheimer's disease, unspecified: Secondary | ICD-10-CM

## 2013-02-26 DIAGNOSIS — I4729 Other ventricular tachycardia: Secondary | ICD-10-CM

## 2013-02-26 DIAGNOSIS — R778 Other specified abnormalities of plasma proteins: Secondary | ICD-10-CM | POA: Diagnosis present

## 2013-02-26 LAB — COMPREHENSIVE METABOLIC PANEL
ALT: 94 U/L — ABNORMAL HIGH (ref 0–53)
AST: 80 U/L — ABNORMAL HIGH (ref 0–37)
Albumin: 1.8 g/dL — ABNORMAL LOW (ref 3.5–5.2)
Alkaline Phosphatase: 89 U/L (ref 39–117)
BUN: 54 mg/dL — ABNORMAL HIGH (ref 6–23)
CHLORIDE: 110 meq/L (ref 96–112)
CO2: 21 mEq/L (ref 19–32)
CREATININE: 1.7 mg/dL — AB (ref 0.50–1.35)
Calcium: 8.2 mg/dL — ABNORMAL LOW (ref 8.4–10.5)
GFR calc Af Amer: 42 mL/min — ABNORMAL LOW (ref >90)
GFR, EST NON AFRICAN AMERICAN: 37 mL/min — AB (ref >90)
Glucose, Bld: 88 mg/dL (ref 70–99)
Potassium: 3.9 mEq/L (ref 3.7–5.3)
Sodium: 143 mEq/L (ref 137–147)
Total Protein: 4.9 g/dL — ABNORMAL LOW (ref 6.0–8.3)

## 2013-02-26 LAB — CBC
HEMATOCRIT: 23.3 % — AB (ref 39.0–52.0)
Hemoglobin: 7.8 g/dL — ABNORMAL LOW (ref 13.0–17.0)
MCH: 31 pg (ref 26.0–34.0)
MCHC: 33.5 g/dL (ref 30.0–36.0)
MCV: 92.5 fL (ref 78.0–100.0)
PLATELETS: 184 10*3/uL (ref 150–400)
RBC: 2.52 MIL/uL — ABNORMAL LOW (ref 4.22–5.81)
RDW: 15.2 % (ref 11.5–15.5)
WBC: 6.2 10*3/uL (ref 4.0–10.5)

## 2013-02-26 LAB — HEPARIN LEVEL (UNFRACTIONATED): Heparin Unfractionated: 0.43 IU/mL (ref 0.30–0.70)

## 2013-02-26 NOTE — Progress Notes (Signed)
PULMONARY  / CRITICAL CARE MEDICINE CONSULTATION  Name: Casey Moore MRN: [id number] DOB: 02/15/1931    ADMISSION DATE:  02/23/2013  CHIEF COMPLAINT:  Dyspnea  BRIEF PATIENT DESCRIPTION: 80 year-old male with dementia brought in from the facility by wife for worsening shortness of breath. Admitted to cardiology for presumed cardiac shock in the setting of new atrial fibrillation with RVR. Critical care consulted for assistance in management.  SIGNIFICANT EVENTS / STUDIES:  1. Cardioverted 02/25/2012  2. 1/25 Back into A fib with episode of 6 run questionable torsades, asymptomatic  LINES / TUBES: 1. Peripheral IVs  CULTURES: 1. Blood cultures 1/24- 2. Urine cultures 1/24- 3. Sputum cultures 1/24-  ANTIBIOTICS: 1. Vanc 1/24-1/25 2. Cefepime and 1/24-1/25 3. Azithro 1/24- 4. Tamiflu 1/24-1/24  Subjective: No distress, off pressors, sounds to be regular. Some threats against nursing staff yesterday.  PHYSICAL EXAM  VITAL SIGNS: Temp:  [97.5 F (36.4 C)-98.9 F (37.2 C)] 97.5 F (36.4 C) (01/26 0400) Pulse Rate:  [44-81] 66 (01/26 0600) Resp:  [14-26] 26 (01/26 0600) BP: (67-117)/(35-69) 96/58 mmHg (01/26 0600) SpO2:  [84 %-100 %] 99 % (01/26 0600)  HEMODYNAMICS:    VENTILATOR SETTINGS:    INTAKE / OUTPUT: Intake/Output     01/25 0701 - 01/26 0700   P.O. 605   I.V. (mL/kg) 2244.9 (26.6)   IV Piggyback 50   Total Intake(mL/kg) 2899.9 (34.4)   Urine (mL/kg/hr) 1410 (0.7)   Total Output 1410   Net +1489.9       Stool Occurrence 2 x     PHYSICAL EXAMINATION: General:  Elderly male in no acute distress Neuro:  responds to name, much more alert and cooperative HEENT:  jvd wnl Neck:  No lymphad Cardiovascular:  s1 s2 regular Lungs:   cta bilaterally Abdomen:   soft, nontender, nondistended, positive bowel sounds Musculoskeletal:   No edema  LABS:  CBC Recent Labs     02/25/13  0337  02/25/13  1750  02/26/13  0315  WBC  10.2  6.8  6.2  HGB  7.5*   7.7*  7.8*  HCT  22.6*  23.3*  23.3*  PLT  189  180  184    Coag's Recent Labs     02/24/13  0800  INR  1.41    BMET Recent Labs     02/25/13  0337  02/25/13  1750  02/26/13  0315  NA  142  138  143  K  4.0  3.6*  3.9  CL  112  105  110  CO2  16*  16*  21  BUN  65*  56*  54*  CREATININE  2.08*  1.61*  1.70*  GLUCOSE  106*  320*  88    Electrolytes Recent Labs     02/25/13  0337  02/25/13  1750  02/26/13  0315  CALCIUM  8.1*  7.6*  8.2*  MG   --   1.6   --   PHOS   --   3.2   --     Sepsis Markers Recent Labs     02/24/13  0914  02/25/13  0337  PROCALCITON  0.63  0.46    ABG Recent Labs     02/25/13  0450  PHART  7.401  PCO2ART  26.4*  PO2ART  130.0*    Liver Enzymes Recent Labs     02/24/13  0800  02/25/13  0337  02/26/13  0315  AST  86*  83*  80*  ALT  78*  81*  94*  ALKPHOS  101  93  89  BILITOT  0.2*  0.2*  <0.2*  ALBUMIN  2.1*  1.8*  1.8*    Cardiac Enzymes Recent Labs     02/23/13  2052  02/24/13  0352  02/24/13  0730  02/24/13  1430  TROPONINI   --   0.42*  0.35*  0.30*  PROBNP  5417.0*   --    --    --     Glucose Recent Labs     02/24/13  2332  02/25/13  0336  02/25/13  0749  02/25/13  1235  02/25/13  1802  02/25/13  1805  GLUCAP  96  97  75  72  298*  76    Imaging US Renal  02/24/2013   CLINICAL DATA:  Acute renal insufficiency. Hypertension. Prostate carcinoma.  EXAM: RENAL/URINARY TRACT ULTRASOUND COMPLETE  COMPARISON:  12/09/2008  FINDINGS: Right Kidney:  Length: 11.3 cm. Increased renal parenchymal echogenicity noted. Small cysts measuring approximately 2 cm are seen in the upper and lower poles. No mass or hydronephrosis visualized.  Left Kidney:  Length: 11.9 cm. Increased renal parenchymal echogenicity noted. A subcapsular cyst is seen in the upper pole measuring 2.7 cm. No mass or hydronephrosis visualized.  Bladder:  Mildly distended.  Appears normal for degree of bladder distention.  IMPRESSION: Normal  size kidneys with increased renal parenchymal echogenicity, consistent with medical renal disease. No evidence of hydronephrosis.  Mildly distended urinary bladder.   Electronically Signed   By: Earle Gell M.D.   On: 02/24/2013 18:49   Dg Chest Port 1 View  02/25/2013   CLINICAL DATA:  Atrial fibrillation. Followup left lower lobe opacity.  EXAM: PORTABLE CHEST - 1 VIEW  COMPARISON:  02/23/2013  FINDINGS: Left lung base suboptimally visualized due to position of overlying defibrillator pad. Mild left retrocardiac opacity shows no definite change, and may be due to atelectasis or infiltrate. Right lung is clear. Heart size is normal.  IMPRESSION: Technically limited exam. No definite change in mild left basilar atelectasis versus infiltrate.   Electronically Signed   By: Earle Gell M.D.   On: 02/25/2013 10:44   ASSESSMENT / PLAN:  1. Shock: Likely was cardiogenic. Resolved  Empiric abx unlikely required  TSH 0.3, assess t3, t4  -pct algo reassuring  2. Acute respiratory failure:  r/o aspiration- resolved  Saturating well on RA  Dopplers negative LE  3. Atrial fibrillation:  Amiodarone IV per cards  Heparin gtt  4. Acute renal failure: resolving, r/o atn, NON AG acidosis mostly from saline prior  Chem in am   5. Acute transaminitis: shock liver likely  Serial CMP improved  Diet tolerated well  6. Hyperglycemia contrlled  Monitor ssi  7. Hypernatremia: resolved  Monitor  8. Concern for Dysphagia -SLP swallow eval  9. Anemia - Likely dilutional -Trend CBC  10. Dementia - Lives in memory care unit chronically and may not have great functional baseline.  Vertell Novak, PGY-3 6:53 AM,02/26/2013  Amio drip per cards.  Stop date in place for zithromax.  Little else for PCCM to offer.  PCCM will sign off, please call back if needed.  May get out of the ICU when cards feels appropriate.  Patient seen and examined, agree with above note.  I dictated the care and  orders written for this patient under my direction.  Rush Farmer, MD [phone]

## 2013-02-26 NOTE — Evaluation (Addendum)
Clinical/Bedside Swallow Evaluation Patient Details  Name: Casey Moore MRN: [id number] Date of Birth: 1931/10/30  Today's Date: 02/26/2013 Time: 4403-4742 SLP Time Calculation (min): 27 min  Past Medical History:  Past Medical History  Diagnosis Date  . Osteoarthritis   . Hypertension   . Hx of adenomatous colonic polyps   . Diverticulosis   . Prostate cancer   . Paget's disease   . Kidney stone   . Sinusitis   . SDAT (senile dementia of Alzheimer's type)    Past Surgical History:  Past Surgical History  Procedure Laterality Date  . Cataract extraction      left-with implant  . Total knee arthroplasty      bilateral  . Mastoid surgery      x 5  . Tonsillectomy and adenoidectomy    . Appendectomy    . Knee surgery      bilateral  . Prostatectomy    . Skin cancer excision      right hip   HPI:  80 year-old male with dementia brought in from the facility by wife for worsening shortness of breath. Admitted to cardiology for presumed cardiac shock in the setting of new atrial fibrillation with RVR.    Assessment / Plan / Recommendation Clinical Impression  Overall pt demonstrated adequate evidence of airway protection and mastication of solids. He did have one cough response likely due to requirement of total assist for feeding. Cough was immediate and strong indicating good sensation and airway protection. He also gagged once, but continued meal without difficulty. Recommend continue current diet with total assist for feeding. No SLP f/u indicated at this time.     Aspiration Risk  Mild    Diet Recommendation Regular;Thin liquid   Liquid Administration via: Cup;Straw Medication Administration: Whole meds with liquid Supervision: Staff to assist with self feeding Compensations: Slow rate;Small sips/bites Postural Changes and/or Swallow Maneuvers: Seated upright 90 degrees    Other  Recommendations Oral Care Recommendations: Oral care BID   Follow Up  Recommendations  None    Frequency and Duration        Pertinent Vitals/Pain NA    SLP Swallow Goals     Swallow Study Prior Functional Status       General HPI: 80 year-old male with dementia brought in from the facility by wife for worsening shortness of breath. Admitted to cardiology for presumed cardiac shock in the setting of new atrial fibrillation with RVR.  Type of Study: Bedside swallow evaluation Previous Swallow Assessment: nonein chart Diet Prior to this Study: Regular;Thin liquids Temperature Spikes Noted: No Respiratory Status: Room air History of Recent Intubation: No Behavior/Cognition: Alert;Cooperative;Requires cueing Oral Cavity - Dentition: Adequate natural dentition Self-Feeding Abilities: Total assist Patient Positioning: Upright in bed Baseline Vocal Quality: Clear Volitional Cough: Strong Volitional Swallow: Able to elicit    Oral/Motor/Sensory Function Overall Oral Motor/Sensory Function: Appears within functional limits for tasks assessed   Ice Chips Ice chips: Not tested   Thin Liquid Thin Liquid: Within functional limits    Nectar Thick Nectar Thick Liquid: Not tested   Honey Thick Honey Thick Liquid: Not tested   Puree Puree: Within functional limits   Solid   GO    Solid: Within functional limits      [facility], [place] CCC-SLP [phone]  Lynann Beaver 02/26/2013,9:15 AM

## 2013-02-26 NOTE — Progress Notes (Signed)
ANTICOAGULATION CONSULT NOTE - Follow Up Consult  Pharmacy Consult for Heparin gtt Indication: atrial fibrillation, new onset  No Known Allergies  Patient Measurements: Height: 5\' 9"  (175.3 cm) Weight: 186 lb 1.1 oz (84.4 kg) IBW/kg (Calculated) : 70.7 Heparin Dosing Weight: 81.3 kg (actual body wt)  Vital Signs: Temp: 97.5 F (36.4 C) (01/26 0400) Temp src: Oral (01/26 0400) BP: 88/64 mmHg (01/26 1000) Pulse Rate: 33 (01/26 1000)  Labs:  Recent Labs  02/24/13 0352 02/24/13 0730  02/24/13 0800 02/24/13 1430 02/24/13 1915 02/25/13 0337 02/25/13 1750 02/26/13 0315  HGB  --   --   --   --   --   --  7.5* 7.7* 7.8*  HCT  --   --   --   --   --   --  22.6* 23.3* 23.3*  PLT  --   --   --   --   --   --  189 180 184  LABPROT  --   --   --  16.9*  --   --   --   --   --   INR  --   --   --  1.41  --   --   --   --   --   HEPARINUNFRC  --   --   < > 0.22*  --  0.26* 0.38  --  0.43  CREATININE  --   --   --  2.90*  --   --  2.08* 1.61* 1.70*  TROPONINI 0.42* 0.35*  --   --  0.30*  --   --   --   --   < > = values in this interval not displayed.  Estimated Creatinine Clearance: 35.2 ml/min (by C-G formula based on Cr of 1.7).   Medications:  Scheduled:  . aspirin EC  81 mg Oral Daily  . azithromycin  250 mg Oral Daily  . donepezil  10 mg Oral Daily   Infusions:  . sodium chloride 10 mL/hr at 02/25/13 2000  . amiodarone (NEXTERONE PREMIX) 360 mg/200 mL dextrose 30 mg/hr (02/25/13 2205)  . heparin 1,450 Units/hr (02/26/13 7001)    Assessment: 80 yo M with new onset afib w/RVR s/p unsuccessful cardioversion 1/24.  Anticoagulation: heparin for new onset afib/RVR. Heparin level 0.43 in goal. Hgb 7.8 low but stable (Hgb 12.4 on admit). Plts 184 ok.  Infectious Disease: Afeb. WBC 6.2. Continued Zithromax 250mg /d 1. Vanc 1/24-1/25 2. Cefepime and 1/24-1/25 3. Azithro 1/24- 4. Tamiflu 1/24-1/24  Cardiovascular: 88/64, HR 33 on ASA81, Amiodarone drip  Endocrinology:  Glucose 88  Gastrointestinal / Nutrition:  Neurology: Aricept  Nephrology: Scr 1.7. CrCl 35.Marland Kitchen Lytes ok. K=3.9.  Pulmonary: 96%  Hematology / Oncology: Hgb down to 7.8  PTA Medication Issues  Best Practices   Goal of Therapy:  Heparin level 0.3-0.7 units/ml Monitor platelets by anticoagulation protocol: Yes   Plan:  Continue IV heparin at 1450 units/hr F/u plan  Tehilla Coffel S. Alford Highland, PharmD, [facility] Clinical Staff Pharmacist Pager [phone]  02/26/2013 10:13 AM

## 2013-02-26 NOTE — Care Management Note (Addendum)
    Page 1 of 2   02/28/2013     10:22:27 AM   CARE MANAGEMENT NOTE 02/28/2013  Patient:  Casey Moore, Casey Moore   Account Number:  [id number]  Date Initiated:  02/26/2013  Documentation initiated by:  Elissa Hefty  Subjective/Objective Assessment:   at fib     Action/Plan:   lives at [facility], supp wife, pcp dr Osborne Casco   Anticipated DC Date:  02/28/2013   Anticipated DC Plan:  [place] referral  Clinical Social Worker      DC Planning Services  CM consult      [facility] Choice  Resumption Of Svcs/PTA Provider   Choice offered to / List presented to:          [facility] arranged  HH-1 RN  [place] agency  [place]   Status of service:   Medicare Important Message given?   (If response is "NO", the following Medicare IM given date fields will be blank) Date Medicare IM given:   Date Additional Medicare IM given:    Discharge Disposition:  [place]  Per UR Regulation:  Reviewed for med. necessity/level of care/duration of stay  If discussed at [place] of Stay Meetings, dates discussed:   03/01/2013    Comments:  1/28  1020 debbie Shannen [place] rn,bsn alerted mary w [facility] of dc back to alf today. spoke w sw that pt from alf memory care unit. will need ambulance transfer.  1/27  would have 40.00 copay for eliquis or xarelto if needed per cm sec. no prior auth req.  1/26 1238 debbie Ashtin Rosner rn,bsn recieved call from mary w [facility]. pt act w [facility] pta for rn-pt-ot. they will follow along til disch.

## 2013-02-26 NOTE — Progress Notes (Signed)
DAILY PROGRESS NOTE  Subjective:  Pleasantly demented in no distress. No complaints. He unfortunately reverted back to atrial fibrillation after cardioversion. Echo shows EF 35-40% with regional wall motion abnormalities, troponin was mildly elevated which may be due to CHF. Vascular dopplers yesterday showed evidence for +DVT in the right popliteal, posterior tibial and peroneal veins.  Possible torsades noted yesterday - he was supposed to go on po amiodarone, but IV amiodarone was restarted. Of noted, he had a recent significant fall with C2 fracture and subdural hemorrhage in December 2014.  Objective:  Temp:  [97.5 F (36.4 C)-98.9 F (37.2 C)] 97.5 F (36.4 C) (01/26 0400) Pulse Rate:  [33-81] 66 (01/26 1100) Resp:  [15-30] 30 (01/26 1100) BP: (71-117)/(35-66) 84/58 mmHg (01/26 1100) SpO2:  [84 %-100 %] 96 % (01/26 1100) Weight change:   Intake/Output from previous day: 01/25 0701 - 01/26 0700 In: 2941.1 [P.O.:605; I.V.:2286.1; IV Piggyback:50] Out: 1410 [Urine:1410]  Intake/Output from this shift: Total I/O In: 464.8 [P.O.:300; I.V.:164.8] Out: -   Medications: Current Facility-Administered Medications  Medication Dose Route Frequency Provider Last Rate Last Dose  . 0.45 % sodium chloride infusion   Intravenous Continuous Olga Millers, MD 10 mL/hr at 02/25/13 2000    . amiodarone (NEXTERONE PREMIX) 360 mg/200 mL dextrose IV infusion  30 mg/hr Intravenous Continuous Dorothy Spark, MD 16.7 mL/hr at 02/26/13 1050 30 mg/hr at 02/26/13 1050  . aspirin EC tablet 81 mg  81 mg Oral Daily Javier Docker, MD   81 mg at 02/26/13 1119  . azithromycin (ZITHROMAX) tablet 250 mg  250 mg Oral Daily Olga Millers, MD   250 mg at 02/26/13 1119  . donepezil (ARICEPT) tablet 10 mg  10 mg Oral Daily Javier Docker, MD   10 mg at 02/26/13 1119  . haloperidol lactate (HALDOL) injection 5 mg  5 mg Intravenous Q6H PRN Javier Docker, MD      . heparin ADULT infusion 100 units/mL (25000  units/250 mL)  1,450 Units/hr Intravenous Continuous Troy Sine, MD 14.5 mL/hr at 02/26/13 0824 1,450 Units/hr at 02/26/13 0824  . loperamide (IMODIUM) capsule 4 mg  4 mg Oral PRN Troy Sine, MD   4 mg at 02/25/13 2305  . ondansetron (ZOFRAN) tablet 4 mg  4 mg Oral Q8H PRN Olga Millers, MD        Physical Exam: General appearance: alert and no distress Neck: no carotid bruit and no JVD Lungs: diminished breath sounds bibasilar Heart: regular rate and rhythm, S1, S2 normal and systolic murmur: early systolic 3/6, crescendo at apex Abdomen: soft, non-tender; bowel sounds normal; no masses,  no organomegaly Extremities: edema 2+ RLE, 1+ LLE Pulses: 2+ and symmetric Skin: pale, warm, dry Neurologic: Mental status: awake, not oriented Psych: Frequent outbursts   Lab Results: Results for orders placed during the hospital encounter of 02/23/13 (from the past 48 hour(s))  TROPONIN I     Status: Abnormal   Collection Time    02/24/13  2:30 PM      Result Value Range   Troponin I 0.30 (*) <0.30 ng/mL   Comment:            Due to the release kinetics of cTnI,     a negative result within the first hours     of the onset of symptoms does not rule out     myocardial infarction with certainty.     If myocardial infarction is still suspected,  repeat the test at appropriate intervals.     CRITICAL VALUE NOTED.  VALUE IS CONSISTENT WITH PREVIOUSLY REPORTED AND CALLED VALUE.  URINALYSIS, ROUTINE W REFLEX MICROSCOPIC     Status: Abnormal   Collection Time    02/24/13  2:51 PM      Result Value Range   Color, Urine YELLOW  YELLOW   APPearance CLOUDY (*) CLEAR   Specific Gravity, Urine 1.019  1.005 - 1.030   pH 5.0  5.0 - 8.0   Glucose, UA NEGATIVE  NEGATIVE mg/dL   Hgb urine dipstick TRACE (*) NEGATIVE   Bilirubin Urine NEGATIVE  NEGATIVE   Ketones, ur NEGATIVE  NEGATIVE mg/dL   Protein, ur NEGATIVE  NEGATIVE mg/dL   Urobilinogen, UA 0.2  0.0 - 1.0 mg/dL   Nitrite NEGATIVE   NEGATIVE   Leukocytes, UA NEGATIVE  NEGATIVE  URINE CULTURE     Status: None   Collection Time    02/24/13  2:51 PM      Result Value Range   Specimen Description URINE, CLEAN CATCH     Special Requests Normal     Culture  Setup Time       Value: 02/24/2013 22:13     Performed at [facility]       Value: NO GROWTH     Performed at [facility]   Culture       Value: NO GROWTH     Performed at [facility]   Report Status 02/25/2013 FINAL    URINE MICROSCOPIC-ADD ON     Status: None   Collection Time    02/24/13  2:51 PM      Result Value Range   Urine-Other AMORPHOUS URATES/PHOSPHATES    SODIUM, URINE, RANDOM     Status: None   Collection Time    02/24/13  2:53 PM      Result Value Range   Sodium, Ur 52    GLUCOSE, CAPILLARY     Status: None   Collection Time    02/24/13  3:59 PM      Result Value Range   Glucose-Capillary 98  70 - 99 mg/dL  HEPARIN LEVEL (UNFRACTIONATED)     Status: Abnormal   Collection Time    02/24/13  7:15 PM      Result Value Range   Heparin Unfractionated 0.26 (*) 0.30 - 0.70 IU/mL   Comment:            IF HEPARIN RESULTS ARE BELOW     EXPECTED VALUES, AND PATIENT     DOSAGE HAS BEEN CONFIRMED,     SUGGEST FOLLOW UP TESTING     OF ANTITHROMBIN III LEVELS.  GLUCOSE, CAPILLARY     Status: None   Collection Time    02/24/13  9:14 PM      Result Value Range   Glucose-Capillary 87  70 - 99 mg/dL  GLUCOSE, CAPILLARY     Status: None   Collection Time    02/24/13 11:32 PM      Result Value Range   Glucose-Capillary 96  70 - 99 mg/dL  GLUCOSE, CAPILLARY     Status: None   Collection Time    02/25/13  3:36 AM      Result Value Range   Glucose-Capillary 97  70 - 99 mg/dL  COMPREHENSIVE METABOLIC PANEL     Status: Abnormal   Collection Time    02/25/13  3:37 AM  Result Value Range   Sodium 142  137 - 147 mEq/L   Comment: DELTA CHECK NOTED   Potassium 4.0  3.7 - 5.3 mEq/L   Chloride 112  96 -  112 mEq/L   CO2 16 (*) 19 - 32 mEq/L   Glucose, Bld 106 (*) 70 - 99 mg/dL   BUN 65 (*) 6 - 23 mg/dL   Creatinine, Ser 2.08 (*) 0.50 - 1.35 mg/dL   Calcium 8.1 (*) 8.4 - 10.5 mg/dL   Total Protein 5.1 (*) 6.0 - 8.3 g/dL   Albumin 1.8 (*) 3.5 - 5.2 g/dL   AST 83 (*) 0 - 37 U/L   ALT 81 (*) 0 - 53 U/L   Alkaline Phosphatase 93  39 - 117 U/L   Total Bilirubin 0.2 (*) 0.3 - 1.2 mg/dL   GFR calc non Af Amer 29 (*) >90 mL/min   GFR calc Af Amer 33 (*) >90 mL/min   Comment: (NOTE)     The eGFR has been calculated using the CKD EPI equation.     This calculation has not been validated in all clinical situations.     eGFR's persistently <90 mL/min signify possible Chronic Kidney     Disease.  CBC WITH DIFFERENTIAL     Status: Abnormal   Collection Time    02/25/13  3:37 AM      Result Value Range   WBC 10.2  4.0 - 10.5 K/uL   RBC 2.38 (*) 4.22 - 5.81 MIL/uL   Hemoglobin 7.5 (*) 13.0 - 17.0 g/dL   Comment: REPEATED TO VERIFY   HCT 22.6 (*) 39.0 - 52.0 %   MCV 95.0  78.0 - 100.0 fL   MCH 31.5  26.0 - 34.0 pg   MCHC 33.2  30.0 - 36.0 g/dL   RDW 15.6 (*) 11.5 - 15.5 %   Platelets 189  150 - 400 K/uL   Neutrophils Relative % 85 (*) 43 - 77 %   Neutro Abs 8.7 (*) 1.7 - 7.7 K/uL   Lymphocytes Relative 7 (*) 12 - 46 %   Lymphs Abs 0.8  0.7 - 4.0 K/uL   Monocytes Relative 6  3 - 12 %   Monocytes Absolute 0.6  0.1 - 1.0 K/uL   Eosinophils Relative 2  0 - 5 %   Eosinophils Absolute 0.2  0.0 - 0.7 K/uL   Basophils Relative 0  0 - 1 %   Basophils Absolute 0.0  0.0 - 0.1 K/uL  PROCALCITONIN     Status: None   Collection Time    02/25/13  3:37 AM      Result Value Range   Procalcitonin 0.46     Comment:            Interpretation:     PCT (Procalcitonin) <= 0.5 ng/mL:     Systemic infection (sepsis) is not likely.     Local bacterial infection is possible.     (NOTE)             ICU PCT Algorithm               Non ICU PCT Algorithm        ----------------------------      ------------------------------             PCT < 0.25 ng/mL                 PCT < 0.1 ng/mL  Stopping of antibiotics            Stopping of antibiotics           strongly encouraged.               strongly encouraged.        ----------------------------     ------------------------------           PCT level decrease by               PCT < 0.25 ng/mL           >= 80% from peak PCT           OR PCT 0.25 - 0.5 ng/mL          Stopping of antibiotics                                                 encouraged.         Stopping of antibiotics               encouraged.        ----------------------------     ------------------------------           PCT level decrease by              PCT >= 0.25 ng/mL           < 80% from peak PCT            AND PCT >= 0.5 ng/mL            Continuing antibiotics                                                  encouraged.           Continuing antibiotics                encouraged.        ----------------------------     ------------------------------         PCT level increase compared          PCT > 0.5 ng/mL             with peak PCT AND              PCT >= 0.5 ng/mL             Escalation of antibiotics                                              strongly encouraged.          Escalation of antibiotics            strongly encouraged.  HEPARIN LEVEL (UNFRACTIONATED)     Status: None   Collection Time    02/25/13  3:37 AM      Result Value Range   Heparin Unfractionated 0.38  0.30 - 0.70 IU/mL   Comment:            IF HEPARIN RESULTS ARE BELOW     EXPECTED VALUES, AND PATIENT  DOSAGE HAS BEEN CONFIRMED,     SUGGEST FOLLOW UP TESTING     OF ANTITHROMBIN III LEVELS.  BLOOD GAS, ARTERIAL     Status: Abnormal   Collection Time    02/25/13  4:50 AM      Result Value Range   O2 Content 3.0     Delivery systems NASAL CANNULA     pH, Arterial 7.401  7.350 - 7.450   pCO2 arterial 26.4 (*) 35.0 - 45.0 mmHg   pO2, Arterial 130.0 (*) 80.0 - 100.0  mmHg   Bicarbonate 16.1 (*) 20.0 - 24.0 mEq/L   TCO2 16.9  0 - 100 mmol/L   Acid-base deficit 7.8 (*) 0.0 - 2.0 mmol/L   O2 Saturation 99.0     Patient temperature 98.6     Collection site LEFT RADIAL     Drawn by [id number]     Sample type ARTERIAL DRAW     Allens test (pass/fail) PASS  PASS  GLUCOSE, CAPILLARY     Status: None   Collection Time    02/25/13  7:49 AM      Result Value Range   Glucose-Capillary 75  70 - 99 mg/dL  T3, FREE     Status: Abnormal   Collection Time    02/25/13 12:00 PM      Result Value Range   T3, Free 2.2 (*) 2.3 - 4.2 pg/mL   Comment: Performed at [place], CAPILLARY     Status: None   Collection Time    02/25/13 12:35 PM      Result Value Range   Glucose-Capillary 72  70 - 99 mg/dL  CBC     Status: Abnormal   Collection Time    02/25/13  5:50 PM      Result Value Range   WBC 6.8  4.0 - 10.5 K/uL   RBC 2.48 (*) 4.22 - 5.81 MIL/uL   Hemoglobin 7.7 (*) 13.0 - 17.0 g/dL   HCT 23.3 (*) 39.0 - 52.0 %   MCV 94.0  78.0 - 100.0 fL   MCH 31.0  26.0 - 34.0 pg   MCHC 33.0  30.0 - 36.0 g/dL   RDW 15.3  11.5 - 15.5 %   Platelets 180  150 - 400 K/uL  BASIC METABOLIC PANEL     Status: Abnormal   Collection Time    02/25/13  5:50 PM      Result Value Range   Sodium 138  137 - 147 mEq/L   Potassium 3.6 (*) 3.7 - 5.3 mEq/L   Chloride 105  96 - 112 mEq/L   CO2 16 (*) 19 - 32 mEq/L   Glucose, Bld 320 (*) 70 - 99 mg/dL   BUN 56 (*) 6 - 23 mg/dL   Creatinine, Ser 1.61 (*) 0.50 - 1.35 mg/dL   Calcium 7.6 (*) 8.4 - 10.5 mg/dL   GFR calc non Af Amer 39 (*) >90 mL/min   GFR calc Af Amer 45 (*) >90 mL/min   Comment: (NOTE)     The eGFR has been calculated using the CKD EPI equation.     This calculation has not been validated in all clinical situations.     eGFR's persistently <90 mL/min signify possible Chronic Kidney     Disease.  MAGNESIUM     Status: None   Collection Time    02/25/13  5:50 PM      Result Value Range   Magnesium  1.6  1.5 -  2.5 mg/dL  PHOSPHORUS     Status: None   Collection Time    02/25/13  5:50 PM      Result Value Range   Phosphorus 3.2  2.3 - 4.6 mg/dL  GLUCOSE, CAPILLARY     Status: Abnormal   Collection Time    02/25/13  6:02 PM      Result Value Range   Glucose-Capillary 298 (*) 70 - 99 mg/dL  GLUCOSE, CAPILLARY     Status: None   Collection Time    02/25/13  6:05 PM      Result Value Range   Glucose-Capillary 76  70 - 99 mg/dL  HEPARIN LEVEL (UNFRACTIONATED)     Status: None   Collection Time    02/26/13  3:15 AM      Result Value Range   Heparin Unfractionated 0.43  0.30 - 0.70 IU/mL   Comment:            IF HEPARIN RESULTS ARE BELOW     EXPECTED VALUES, AND PATIENT     DOSAGE HAS BEEN CONFIRMED,     SUGGEST FOLLOW UP TESTING     OF ANTITHROMBIN III LEVELS.  CBC     Status: Abnormal   Collection Time    02/26/13  3:15 AM      Result Value Range   WBC 6.2  4.0 - 10.5 K/uL   RBC 2.52 (*) 4.22 - 5.81 MIL/uL   Hemoglobin 7.8 (*) 13.0 - 17.0 g/dL   HCT 23.3 (*) 39.0 - 52.0 %   MCV 92.5  78.0 - 100.0 fL   MCH 31.0  26.0 - 34.0 pg   MCHC 33.5  30.0 - 36.0 g/dL   RDW 15.2  11.5 - 15.5 %   Platelets 184  150 - 400 K/uL  COMPREHENSIVE METABOLIC PANEL     Status: Abnormal   Collection Time    02/26/13  3:15 AM      Result Value Range   Sodium 143  137 - 147 mEq/L   Potassium 3.9  3.7 - 5.3 mEq/L   Chloride 110  96 - 112 mEq/L   CO2 21  19 - 32 mEq/L   Glucose, Bld 88  70 - 99 mg/dL   BUN 54 (*) 6 - 23 mg/dL   Creatinine, Ser 1.70 (*) 0.50 - 1.35 mg/dL   Calcium 8.2 (*) 8.4 - 10.5 mg/dL   Total Protein 4.9 (*) 6.0 - 8.3 g/dL   Albumin 1.8 (*) 3.5 - 5.2 g/dL   AST 80 (*) 0 - 37 U/L   ALT 94 (*) 0 - 53 U/L   Alkaline Phosphatase 89  39 - 117 U/L   Total Bilirubin <0.2 (*) 0.3 - 1.2 mg/dL   GFR calc non Af Amer 37 (*) >90 mL/min   GFR calc Af Amer 42 (*) >90 mL/min   Comment: (NOTE)     The eGFR has been calculated using the CKD EPI equation.     This calculation has  not been validated in all clinical situations.     eGFR's persistently <90 mL/min signify possible Chronic Kidney     Disease.    Imaging: US Renal  02/24/2013   CLINICAL DATA:  Acute renal insufficiency. Hypertension. Prostate carcinoma.  EXAM: RENAL/URINARY TRACT ULTRASOUND COMPLETE  COMPARISON:  12/09/2008  FINDINGS: Right Kidney:  Length: 11.3 cm. Increased renal parenchymal echogenicity noted. Small cysts measuring approximately 2 cm are seen in the upper and lower poles. No mass or  hydronephrosis visualized.  Left Kidney:  Length: 11.9 cm. Increased renal parenchymal echogenicity noted. A subcapsular cyst is seen in the upper pole measuring 2.7 cm. No mass or hydronephrosis visualized.  Bladder:  Mildly distended.  Appears normal for degree of bladder distention.  IMPRESSION: Normal size kidneys with increased renal parenchymal echogenicity, consistent with medical renal disease. No evidence of hydronephrosis.  Mildly distended urinary bladder.   Electronically Signed   By: Earle Gell M.D.   On: 02/24/2013 18:49   Dg Chest Port 1 View  02/25/2013   CLINICAL DATA:  Atrial fibrillation. Followup left lower lobe opacity.  EXAM: PORTABLE CHEST - 1 VIEW  COMPARISON:  02/23/2013  FINDINGS: Left lung base suboptimally visualized due to position of overlying defibrillator pad. Mild left retrocardiac opacity shows no definite change, and may be due to atelectasis or infiltrate. Right lung is clear. Heart size is normal.  IMPRESSION: Technically limited exam. No definite change in mild left basilar atelectasis versus infiltrate.   Electronically Signed   By: Earle Gell M.D.   On: 02/25/2013 10:44    Assessment:  Principal Problem:   Atrial fibrillation Active Problems:   Risk for falls   Alzheimer's dementia with behavioral disturbance   Cardiomyopathy, ischemic   Acute deep vein thrombosis (DVT) of distal vein of right lower extremity   Torsades de pointes   Elevated  troponin   Plan:  1. Mr. Casey Moore is in an unfoturnate situation. He presented with a-fib and RVR, is found to have mildly elevated troponin which may be a Type II mechanism. EF is 35-40% with regional wall motion abnormalities. He has advanced Alzheimer's type dementia and has been in a long-term memory unit for several years. He also recently had a significant fall with C2 fracture and subdural hematoma.  He was cardioverted on admission for his a-fib and was started on anticoagulation - however, it would appear that he is a very poor anticoagulation candidate. In addition, LE dopplers performed yesterday demonstrate distal Right leg DVT which is acute.  He remains hypotensive and PE cannot be ruled-out.   I discussed options with his wife today with regards to his a-fib and long-term anticoagulation management. I do feel he is a poor long-term anticoagulation candidate, however, he has a-fib and DVT. The DVT is distal, however, there is still embolic potential.  One option may be placement of a permanent [place] filter and use short term anticoagulation with Eliquis or Xarelto -> then ultimately switch to aspirin maintenance.  A rhythm control strategy with his a-fib appears to have failed.  It may be better to pursue rate-control rather than repeated attempts to restore sinus.  Finally, his wife (and POA) did indicate that his is DNR.  Time Spent Directly with Patient:  30 minutes  Length of Stay:  LOS: 3 days   Pixie Casino, MD, [facility] Attending Cardiologist [facility]  Moselle Rister C 02/26/2013, 11:54 AM

## 2013-02-27 LAB — CBC
HEMATOCRIT: 24.4 % — AB (ref 39.0–52.0)
Hemoglobin: 8.3 g/dL — ABNORMAL LOW (ref 13.0–17.0)
MCH: 31.7 pg (ref 26.0–34.0)
MCHC: 34 g/dL (ref 30.0–36.0)
MCV: 93.1 fL (ref 78.0–100.0)
Platelets: 181 10*3/uL (ref 150–400)
RBC: 2.62 MIL/uL — AB (ref 4.22–5.81)
RDW: 15.4 % (ref 11.5–15.5)
WBC: 5.1 10*3/uL (ref 4.0–10.5)

## 2013-02-27 LAB — HEPARIN LEVEL (UNFRACTIONATED): Heparin Unfractionated: 0.33 IU/mL (ref 0.30–0.70)

## 2013-02-27 MED ORDER — RIVAROXABAN 15 MG PO TABS
15.0000 mg | ORAL_TABLET | Freq: Two times a day (BID) | ORAL | Status: DC
  Administered 2013-02-27 – 2013-02-28 (×2): 15 mg via ORAL
  Filled 2013-02-27 (×4): qty 1

## 2013-02-27 MED ORDER — METOPROLOL TARTRATE 25 MG PO TABS
25.0000 mg | ORAL_TABLET | Freq: Two times a day (BID) | ORAL | Status: DC
  Administered 2013-02-27 – 2013-02-28 (×3): 25 mg via ORAL
  Filled 2013-02-27 (×4): qty 1

## 2013-02-27 MED ORDER — RIVAROXABAN 15 MG PO TABS
15.0000 mg | ORAL_TABLET | ORAL | Status: AC
  Administered 2013-02-27: 15 mg via ORAL
  Filled 2013-02-27: qty 1

## 2013-02-27 NOTE — Consult Note (Signed)
WOC wound consult note Reason for Consult: evaluation of POA left toe ulcer and POA right heel ulcer. Pt pleasantly but using some aggressive words towards [place] nurse at the time of my assessment, feel related to his dementia.  Not really able to answer questions about his wounds appropriately.  Wound type:  sDTI (suspected deep tissue injury) right heel Toe tip ulcerations related to toe deformity (hammer toe)  Pressure Ulcer POA: Yes Measurement: Right heel 5cm x 6cm x 0 Left toe tip: 2.0cm x 1.5cm x 0.2cm  Wound bed:  Right heel intact with blood filled bulla and some reabsorption of the remaining bulla Left toe tip, yellow base  Drainage (amount, consistency, odor) left toe tip has some yellow drainage, minimal amount, no drainage from the right heel presently. Periwound:intact at both sites, with hyperkeratosis at the left toe tip Dressing procedure/placement/frequency: Silver hydrofiber for the left toe tip, wound with drainage and appears chronic Heel foam and Prevalon boot for offloading to prevent further injury to the right heel.   Discussed POC with patient and bedside nurse.  Re consult if needed, will not follow at this time. Thanks  Brunilda Eble Kellogg, [place] [phone])

## 2013-02-27 NOTE — Progress Notes (Signed)
DAILY PROGRESS NOTE  Subjective:  No events overnight. A-fib is rate-controlled. I had another discussion with his wife today who is his POA. He is unable to provide much subjective information due to advanced dementia.  Objective:  Temp:  [97 F (36.1 C)-97.7 F (36.5 C)] 97.5 F (36.4 C) (01/27 0902) Pulse Rate:  [51-76] 76 (01/27 0902) Resp:  [13-30] 16 (01/27 0902) BP: (83-117)/(40-71) 106/45 mmHg (01/27 0902) SpO2:  [96 %-100 %] 99 % (01/27 0902) Weight change:   Intake/Output from previous day: 01/26 0701 - 01/27 0700 In: 1847.6 [P.O.:900; I.V.:947.6] Out: 1295 [Urine:1295]  Intake/Output from this shift: Total I/O In: 356 [P.O.:356] Out: -   Medications: Current Facility-Administered Medications  Medication Dose Route Frequency Provider Last Rate Last Dose  . 0.45 % sodium chloride infusion   Intravenous Continuous Olga Millers, MD 10 mL/hr at 02/26/13 2000    . amiodarone (NEXTERONE PREMIX) 360 mg/200 mL dextrose IV infusion  30 mg/hr Intravenous Continuous Dorothy Spark, MD 16.7 mL/hr at 02/26/13 2255 30 mg/hr at 02/26/13 2255  . aspirin EC tablet 81 mg  81 mg Oral Daily Javier Docker, MD   81 mg at 02/27/13 0932  . azithromycin (ZITHROMAX) tablet 250 mg  250 mg Oral Daily Olga Millers, MD   250 mg at 02/27/13 0932  . donepezil (ARICEPT) tablet 10 mg  10 mg Oral Daily Javier Docker, MD   10 mg at 02/27/13 0932  . haloperidol lactate (HALDOL) injection 5 mg  5 mg Intravenous Q6H PRN Javier Docker, MD      . heparin ADULT infusion 100 units/mL (25000 units/250 mL)  1,450 Units/hr Intravenous Continuous Troy Sine, MD 14.5 mL/hr at 02/27/13 0236 1,450 Units/hr at 02/27/13 0236  . loperamide (IMODIUM) capsule 4 mg  4 mg Oral PRN Troy Sine, MD   4 mg at 02/25/13 2305  . ondansetron (ZOFRAN) tablet 4 mg  4 mg Oral Q8H PRN Olga Millers, MD        Physical Exam: General appearance: alert and no distress Neck: no carotid bruit and no  JVD Lungs: diminished breath sounds bibasilar Heart: regular rate and rhythm, S1, S2 normal and systolic murmur: early systolic 3/6, crescendo at apex Abdomen: soft, non-tender; bowel sounds normal; no masses,  no organomegaly Extremities: edema 2+ RLE, 1+ LLE Pulses: 2+ and symmetric Skin: pale, warm, dry Neurologic: Mental status: awake, not oriented Psych: Frequent outbursts   Lab Results: Results for orders placed during the hospital encounter of 02/23/13 (from the past 48 hour(s))  T3, FREE     Status: Abnormal   Collection Time    02/25/13 12:00 PM      Result Value Range   T3, Free 2.2 (*) 2.3 - 4.2 pg/mL   Comment: Performed at [facility], [facility]     Status: None   Collection Time    02/25/13 12:35 PM      Result Value Range   Glucose-Capillary 72  70 - 99 mg/dL  CBC     Status: Abnormal   Collection Time    02/25/13  5:50 PM      Result Value Range   WBC 6.8  4.0 - 10.5 K/uL   RBC 2.48 (*) 4.22 - 5.81 MIL/uL   Hemoglobin 7.7 (*) 13.0 - 17.0 g/dL   HCT 23.3 (*) 39.0 - 52.0 %   MCV 94.0  78.0 - 100.0 fL   MCH 31.0  26.0 -  34.0 pg   MCHC 33.0  30.0 - 36.0 g/dL   RDW 15.3  11.5 - 15.5 %   Platelets 180  150 - 400 K/uL  BASIC METABOLIC PANEL     Status: Abnormal   Collection Time    02/25/13  5:50 PM      Result Value Range   Sodium 138  137 - 147 mEq/L   Potassium 3.6 (*) 3.7 - 5.3 mEq/L   Chloride 105  96 - 112 mEq/L   CO2 16 (*) 19 - 32 mEq/L   Glucose, Bld 320 (*) 70 - 99 mg/dL   BUN 56 (*) 6 - 23 mg/dL   Creatinine, Ser 1.61 (*) 0.50 - 1.35 mg/dL   Calcium 7.6 (*) 8.4 - 10.5 mg/dL   GFR calc non Af Amer 39 (*) >90 mL/min   GFR calc Af Amer 45 (*) >90 mL/min   Comment: (NOTE)     The eGFR has been calculated using the CKD EPI equation.     This calculation has not been validated in all clinical situations.     eGFR's persistently <90 mL/min signify possible Chronic Kidney     Disease.  MAGNESIUM     Status: None   Collection Time     02/25/13  5:50 PM      Result Value Range   Magnesium 1.6  1.5 - 2.5 mg/dL  PHOSPHORUS     Status: None   Collection Time    02/25/13  5:50 PM      Result Value Range   Phosphorus 3.2  2.3 - 4.6 mg/dL  GLUCOSE, CAPILLARY     Status: Abnormal   Collection Time    02/25/13  6:02 PM      Result Value Range   Glucose-Capillary 298 (*) 70 - 99 mg/dL  GLUCOSE, CAPILLARY     Status: None   Collection Time    02/25/13  6:05 PM      Result Value Range   Glucose-Capillary 76  70 - 99 mg/dL  HEPARIN LEVEL (UNFRACTIONATED)     Status: None   Collection Time    02/26/13  3:15 AM      Result Value Range   Heparin Unfractionated 0.43  0.30 - 0.70 IU/mL   Comment:            IF HEPARIN RESULTS ARE BELOW     EXPECTED VALUES, AND PATIENT     DOSAGE HAS BEEN CONFIRMED,     SUGGEST FOLLOW UP TESTING     OF ANTITHROMBIN III LEVELS.  CBC     Status: Abnormal   Collection Time    02/26/13  3:15 AM      Result Value Range   WBC 6.2  4.0 - 10.5 K/uL   RBC 2.52 (*) 4.22 - 5.81 MIL/uL   Hemoglobin 7.8 (*) 13.0 - 17.0 g/dL   HCT 23.3 (*) 39.0 - 52.0 %   MCV 92.5  78.0 - 100.0 fL   MCH 31.0  26.0 - 34.0 pg   MCHC 33.5  30.0 - 36.0 g/dL   RDW 15.2  11.5 - 15.5 %   Platelets 184  150 - 400 K/uL  COMPREHENSIVE METABOLIC PANEL     Status: Abnormal   Collection Time    02/26/13  3:15 AM      Result Value Range   Sodium 143  137 - 147 mEq/L   Potassium 3.9  3.7 - 5.3 mEq/L   Chloride 110  96 -  112 mEq/L   CO2 21  19 - 32 mEq/L   Glucose, Bld 88  70 - 99 mg/dL   BUN 54 (*) 6 - 23 mg/dL   Creatinine, Ser 1.70 (*) 0.50 - 1.35 mg/dL   Calcium 8.2 (*) 8.4 - 10.5 mg/dL   Total Protein 4.9 (*) 6.0 - 8.3 g/dL   Albumin 1.8 (*) 3.5 - 5.2 g/dL   AST 80 (*) 0 - 37 U/L   ALT 94 (*) 0 - 53 U/L   Alkaline Phosphatase 89  39 - 117 U/L   Total Bilirubin <0.2 (*) 0.3 - 1.2 mg/dL   GFR calc non Af Amer 37 (*) >90 mL/min   GFR calc Af Amer 42 (*) >90 mL/min   Comment: (NOTE)     The eGFR has been  calculated using the CKD EPI equation.     This calculation has not been validated in all clinical situations.     eGFR's persistently <90 mL/min signify possible Chronic Kidney     Disease.  HEPARIN LEVEL (UNFRACTIONATED)     Status: None   Collection Time    02/27/13  3:58 AM      Result Value Range   Heparin Unfractionated 0.33  0.30 - 0.70 IU/mL   Comment:            IF HEPARIN RESULTS ARE BELOW     EXPECTED VALUES, AND PATIENT     DOSAGE HAS BEEN CONFIRMED,     SUGGEST FOLLOW UP TESTING     OF ANTITHROMBIN III LEVELS.  CBC     Status: Abnormal   Collection Time    02/27/13  3:58 AM      Result Value Range   WBC 5.1  4.0 - 10.5 K/uL   RBC 2.62 (*) 4.22 - 5.81 MIL/uL   Hemoglobin 8.3 (*) 13.0 - 17.0 g/dL   HCT 24.4 (*) 39.0 - 52.0 %   MCV 93.1  78.0 - 100.0 fL   MCH 31.7  26.0 - 34.0 pg   MCHC 34.0  30.0 - 36.0 g/dL   RDW 15.4  11.5 - 15.5 %   Platelets 181  150 - 400 K/uL    Imaging: No results found.  Assessment:  Principal Problem:   Atrial fibrillation Active Problems:   Risk for falls   Alzheimer's dementia with behavioral disturbance   Cardiomyopathy, ischemic   Acute deep vein thrombosis (DVT) of distal vein of right lower extremity   Torsades de pointes   Elevated troponin   Plan:  Mr. Leinbach has a-fib and DVT.  I would recommend at least a short-term trial on xarelto (without the loading dose) for both indications - he is at high risk of fall and bleeding, however, I feel short term treatment is appropriate. It could be possible to switch to aspirin after 6 months if there are recurrent falls. I did broach the subject of an IVC filter, however, it was felt to be too invasive at this time.  Ok to transfer to telemetry today. Likely d/c back to memory unit tomorrow. Will d/c IV amiodarone and rate-control with b-blocker.  Time Spent Directly with Patient:  15 minutes  Length of Stay:  LOS: 4 days   Pixie Casino, MD, [facility] Attending  Cardiologist [facility]  Usbaldo Pannone C 02/27/2013, 10:30 AM

## 2013-02-27 NOTE — Progress Notes (Addendum)
ANTICOAGULATION CONSULT NOTE - Follow Up Consult  Pharmacy Consult for Heparin gtt>>Xarelto Indication: atrial fibrillation, new onset, new DVT  No Known Allergies  Patient Measurements: Height: 5\' 9"  (175.3 cm) Weight: 186 lb 1.1 oz (84.4 kg) IBW/kg (Calculated) : 70.7 Heparin Dosing Weight: 81.3 kg (actual body wt)  Vital Signs: Temp: 97.5 F (36.4 C) (01/27 0902) Temp src: Oral (01/27 0902) BP: 106/45 mmHg (01/27 0902) Pulse Rate: 76 (01/27 0902)  Labs:  Recent Labs  02/24/13 1430  02/25/13 0337 02/25/13 1750 02/26/13 0315 02/27/13 0358  HGB  --   < > 7.5* 7.7* 7.8* 8.3*  HCT  --   < > 22.6* 23.3* 23.3* 24.4*  PLT  --   < > 189 180 184 181  HEPARINUNFRC  --   < > 0.38  --  0.43 0.33  CREATININE  --   --  2.08* 1.61* 1.70*  --   TROPONINI 0.30*  --   --   --   --   --   < > = values in this interval not displayed.  Estimated Creatinine Clearance: 35.2 ml/min (by C-G formula based on Cr of 1.7).   Medications:  Scheduled:  . aspirin EC  81 mg Oral Daily  . azithromycin  250 mg Oral Daily  . donepezil  10 mg Oral Daily   Infusions:  . sodium chloride 10 mL/hr at 02/26/13 2000  . amiodarone (NEXTERONE PREMIX) 360 mg/200 mL dextrose 30 mg/hr (02/26/13 2255)  . heparin 1,450 Units/hr (02/27/13 0236)    Assessment: 80 yo M with new onset afib w/RVR s/p unsuccessful cardioversion 1/24.  Anticoagulation: Heparin for new onset Afib/RVR. (Note: subdural hemorrhage 12/14 after fall). Heparin level 0.33 in goal. Hgb 8.3 low but stable (Hgb 12.4 on admit). Plts 181 ok. Dopplers revealed R leg DVT. Ordered to change heparin to Xarelto. No dosage reduction required as long as CrCl>30.  Infectious Disease: Afeb. WBC 5.1. Continued Zithromax 250mg /d 1. Vanc 1/24-1/25 2. Cefepime and 1/24-1/25 3. Azithro 1/24- 4. Tamiflu 1/24-1/24  Cardiovascular: 106/45, HR 33-76  EF 35-40% on ASA81, Amiodarone drip  Ortho: recently had a significant fall with C2 fracture and  subdural hematoma  Endocrinology: Glucose 88  Gastrointestinal / Nutrition: cardiac diet  Neurology: Aricept. Alzheimers: pleasantly demented  Nephrology: Scr 1.7. CrCl 35.Marland Kitchen Lytes ok. K=3.9.  Pulmonary: 99%  Hematology / Oncology: Hgb 8.3 stable  PTA Medication Issues  Best Practices   Goal of Therapy:  Heparin level 0.3-0.7 units/ml Monitor platelets by anticoagulation protocol: Yes   Plan:  D/c IV heparin. Start Xarelto 15mg  BID x 21d, then 20mg  daily  Delorice Bannister S. Alford Highland, PharmD, [facility] Clinical Staff Pharmacist Pager [phone]  02/27/2013 9:45 AM

## 2013-02-28 MED ORDER — RIVAROXABAN 15 MG PO TABS: 15.0000 mg | ORAL_TABLET | Freq: Two times a day (BID) | ORAL | Status: DC

## 2013-02-28 MED ORDER — AZITHROMYCIN 250 MG PO TABS: 250.0000 mg | ORAL_TABLET | Freq: Every day | ORAL | Status: AC

## 2013-02-28 MED ORDER — METOPROLOL TARTRATE 25 MG PO TABS: 25.0000 mg | ORAL_TABLET | Freq: Two times a day (BID) | ORAL | Status: DC

## 2013-02-28 NOTE — Progress Notes (Signed)
DAILY PROGRESS NOTE  Subjective:  No events overnight. Now having periods of sinus rhythm.  Off of amiodarone and on xarelto.   Objective:  Temp:  [97 F (36.1 C)-98.1 F (36.7 C)] 98.1 F (36.7 C) (01/28 0800) Pulse Rate:  [54-80] 54 (01/28 0400) Resp:  [13-24] 17 (01/28 0800) BP: (86-130)/(41-72) 110/72 mmHg (01/28 0800) SpO2:  [96 %-100 %] 98 % (01/28 0800) Weight change:   Intake/Output from previous day: 01/27 0701 - 01/28 0700 In: 881.8 [P.O.:707; I.V.:174.8] Out: 800 [Urine:800]  Intake/Output from this shift: Total I/O In: 240 [P.O.:240] Out: 350 [Urine:350]  Medications: Current Facility-Administered Medications  Medication Dose Route Frequency Provider Last Rate Last Dose  . 0.45 % sodium chloride infusion   Intravenous Continuous Casey Millers, MD      . aspirin EC tablet 81 mg  81 mg Oral Daily Casey Docker, MD   81 mg at 02/27/13 0932  . azithromycin (ZITHROMAX) tablet 250 mg  250 mg Oral Daily Casey Millers, MD   250 mg at 02/27/13 0932  . donepezil (ARICEPT) tablet 10 mg  10 mg Oral Daily Casey Docker, MD   10 mg at 02/27/13 0932  . haloperidol lactate (HALDOL) injection 5 mg  5 mg Intravenous Q6H PRN Casey Docker, MD      . loperamide (IMODIUM) capsule 4 mg  4 mg Oral PRN Casey Sine, MD   4 mg at 02/25/13 2305  . metoprolol tartrate (LOPRESSOR) tablet 25 mg  25 mg Oral BID Casey Casino, MD   25 mg at 02/27/13 2137  . ondansetron (ZOFRAN) tablet 4 mg  4 mg Oral Q8H PRN Casey Millers, MD      . Rivaroxaban Alveda Reasons) tablet 15 mg  15 mg Oral BID WC Casey Moore, RPH   15 mg at 02/28/13 5852    Physical Exam: General appearance: alert and no distress Neck: no carotid bruit and no JVD Lungs: diminished breath sounds bibasilar Heart: regular rate and rhythm, S1, S2 normal and systolic murmur: early systolic 3/6, crescendo at apex Abdomen: soft, non-tender; bowel sounds normal; no masses,  no organomegaly Extremities:  edema 2+ RLE, 1+ LLE Pulses: 2+ and symmetric Skin: pale, warm, dry Neurologic: Mental status: awake, not oriented Psych: Frequent outbursts   Lab Results: Results for orders placed during the hospital encounter of 02/23/13 (from the past 48 hour(s))  HEPARIN LEVEL (UNFRACTIONATED)     Status: None   Collection Time    02/27/13  3:58 AM      Result Value Range   Heparin Unfractionated 0.33  0.30 - 0.70 IU/mL   Comment:            IF HEPARIN RESULTS ARE BELOW     EXPECTED VALUES, AND PATIENT     DOSAGE HAS BEEN CONFIRMED,     SUGGEST FOLLOW UP TESTING     OF ANTITHROMBIN III LEVELS.  CBC     Status: Abnormal   Collection Time    02/27/13  3:58 AM      Result Value Range   WBC 5.1  4.0 - 10.5 K/uL   RBC 2.62 (*) 4.22 - 5.81 MIL/uL   Hemoglobin 8.3 (*) 13.0 - 17.0 g/dL   HCT 24.4 (*) 39.0 - 52.0 %   MCV 93.1  78.0 - 100.0 fL   MCH 31.7  26.0 - 34.0 pg   MCHC 34.0  30.0 - 36.0 g/dL   RDW 15.4  11.5 -  15.5 %   Platelets 181  150 - 400 K/uL    Imaging: No results found.  Assessment:  Principal Problem:   Atrial fibrillation Active Problems:   Risk for falls   Alzheimer's dementia with behavioral disturbance   Cardiomyopathy, ischemic   Acute deep vein thrombosis (DVT) of distal vein of right lower extremity   Torsades de pointes   Elevated troponin   Plan:  Casey Moore has a-fib and DVT.  I again recommend at least a short-term trial on xarelto (without the loading dose) for both indications - he is at high risk of fall and bleeding, however, I feel short term treatment is appropriate. It could be possible to switch to aspirin after 6 months if there are recurrent falls. I feel he is appropriate for discharge today back to his long-term memory unit on current medications. Follow-up with APP in 1-2 weeks.  Time Spent Directly with Patient:  15 minutes - additional 15 minutes spent on discharge, speaking with family, etc.  Length of Stay:  LOS: 5 days   Casey Casino, MD, [facility] Attending Cardiologist [facility]  [name] 02/28/2013, 10:03 AM

## 2013-02-28 NOTE — Progress Notes (Signed)
Discharge instructions are completed with patients wife.  Pt will be discharged to [facility] memory unit via [facility]

## 2013-02-28 NOTE — Discharge Summary (Signed)
Physician Discharge Summary  Patient ID: Casey Moore MRN: [id number] DOB/AGE: December 27, 1931 80 y.o.  Admit date: 02/23/2013 Discharge date: 02/28/2013  Admission Diagnoses:  Atrial fibrillation with RVR  Discharge Diagnoses:  Principal Problem:   Atrial fibrillation Active Problems:   Risk for falls   Alzheimer's dementia with behavioral disturbance   Cardiomyopathy, ischemic   Acute deep vein thrombosis (DVT) of distal vein of right lower extremity   Torsades de pointes   Elevated troponin   Type II NSTEMI   Acute renal failure   Acute hepatitis   Shock, cardiogenic   Acute respiratory failure  Discharged Condition: stable  Hospital Course:  [name] is a 80 year old male with history of hypertension, Paget's disease and Alzheimer's dementia. He resides at an adult care facility. His wife visits with him daily. Today she noted him to be more short of breath during dinner. He was having trouble eating as well. This was a change from yesterday. He denies any symptoms, but his wife states he would be unable to provide this information. She is unaware of any issues during the day. She thought that his vital signs were checked daily, but today his blood pressure was not checked. She is unaware of his heart rate today. He has no history of atrial fibrillation, heart failure or coronary artery disease. He is largely sedentary because of balance problems. He has no history of intra-cranial bleeding or GI bleeding.   In the emergency department he was noted to be in atrial fibrillation with rapid ventricular response. His blood pressure was low, systolics 40-98 and MAP around 55-65. His lactate and creatinine were elevated. It did not seem that he was altered beyond his baseline. He was given fluid boluses without improvement in his blood pressure. Given the signs of hypoperfusion the decision was made to cardiovert. He was sedated with fentanyl and versed and he received synchronized  cardioversion at 200j. This was successful with sinus rhythm with some PACs and PVCs. His blood pressure still remained on the lower side. He mental status was about the same and his wife thought this was his baseline.   The patient was admitted.  Amiodarone IV loading started. IV heparin started but he was ultimately changed to Xarelto.  He reverted back to Afib.  PCCM was consulted for help with management.  Phenylephrine was started due to hypotension and DCd after two days.  He was empirically started on Vancomycin, cefepime, azithromycin, Tamiflu.  Blood/urine cultures were negative to date.   Troponin peaked at 0.59.  D-dimer was elevated at 9.74.  Lower extremity venous doppler revealed an acute deep vein thrombosis involving the right popliteal, posterior tibial, and peroneal veins. An enlarged inguinal lymph node was noted on the right.  Kidney US:  Normal size kidneys with increased renal parenchymal echogenicity, consistent with medical renal disease. No evidence of hydronephrosis.  Mildly distended urinary bladder.  2D Echo revealed an EF of 35-40%, possible inferior hypokinesis(see full report below.)   LFT's were elevated and thought to be shock liver.   AlkP trended back down but AST/ALT remained mildly elevated.  Follow up as OP.   On 1/25 he had an episode of torsades which was asymptomatic.   Magnesium was WNL.  The patient continued to improve.  Discussion of permanent Greenfield filter was had with the patient's wife.  He was ultimately switched to rate control strategy with lopressor and amiodarone was stopped.  The patient was seen by Dr. Debara Pickett who felt  he was stable for DC back to [place] unit with Home Health PT and RN services. .  Outpatient considerations:  Rate control.  Continue Xarelto for short term(75months-high fall risk) then change to ASA?  Follow up LFT's     Consults:  Critical Care, WOC  Significant Diagnostic Studies: 2D echo Study  Conclusions  - Left ventricle: The cavity size was normal. Wall thickness was normal. Systolic function was moderately reduced. The estimated ejection fraction was in the range of 35% to 40%. There is hypokinesis of the mid-distalinferior myocardium. There is akinesis of the basalinferior myocardium. The study is not technically sufficient to allow evaluation of LV diastolic function. - Aortic valve: Mildly to moderately calcified annulus. Trileaflet; mildly calcified leaflets. Mild regurgitation. - Mitral valve: Calcified annulus. Mild regurgitation. - Left atrium: The atrium was moderately dilated. - Right ventricle: The cavity size was mildly dilated. Systolic function was mildly to moderately reduced. - Right atrium: The atrium was moderately dilated. There was the appearance of a Chiari network. - Tricuspid valve: Mild-moderate regurgitation. Peak RV-RA gradient: 44mm Hg (S). - Inferior vena cava: Not visualized. Unable to estimate CVP. - Pericardium, extracardiac: There was no pericardial effusion. Impressions:  - Very limited images with substantial shadowing due to air interference. :LV size is normal with roughly estimated LVEF 35-40%, possible inferior hypokinesis - wall motion difficult to assess. Indeterminate diastolic function. Moderate biatrial enlargement. MAC with mild mitral regurgitation. Mild aortic regurgitation. Mild RV dilatation with reduced contraction. Mild to moderate tricuspid regurgitation. Unable to assess PASP. The RV-RA gradient is 26 mmHg (with a normal or low CVP, the PASP would be in normal range). Could consider a followup limited contrast study when patient more stable clinically to better evaluate LV function.   LE venous dopplers Summary:  - Findings consistent with acute deep vein thrombosis involving the right popliteal, posterior tibial, and peroneal veins. An enlarged inguinal lymph node is noted on the right. - No evidence of  deep vein thrombosis involving the left lower extremity. - No evidence of Baker's cyst on the right or left. Other specific details can be found in the table(s) above. Prepared and Electronically Authenticated by   PORTABLE CHEST - 1 VIEW COMPARISON: 02/23/2013  FINDINGS: Left lung base suboptimally visualized due to position of overlying defibrillator pad. Mild left retrocardiac opacity shows no definite change, and may be due to atelectasis or infiltrate. Right lung is clear. Heart size is normal.  IMPRESSION: Technically limited exam. No definite change in mild left basilar atelectasis versus infiltrate.  Treatments: See above  Discharge Exam: Blood pressure 110/72, pulse 54, temperature 98.1 F (36.7 C), temperature source Oral, resp. rate 17, height 5\' 9"  (1.753 m), weight 186 lb 1.1 oz (84.4 kg), SpO2 98.00%.   Disposition: 06-Home-Health Care Svc      Discharge Orders   Future Appointments Provider Department Dept Phone   03/15/2013 11:30 AM Brittainy Ladoris Gene [facility]ont [phone]   Future Orders Complete By Expires   Diet - low sodium heart healthy  As directed    Increase activity slowly  As directed        Medication List    STOP taking these medications       lisinopril 40 MG tablet  Commonly known as:  PRINIVIL,ZESTRIL      TAKE these medications       allopurinol 300 MG tablet  Commonly known as:  ZYLOPRIM  Take 300 mg by mouth daily.  atorvastatin 20 MG tablet  Commonly known as:  LIPITOR  Take 20 mg by mouth daily.     azithromycin 250 MG tablet  Commonly known as:  ZITHROMAX  Take 1 tablet (250 mg total) by mouth daily.  Start taking on:  03/01/2013     donepezil 10 MG tablet  Commonly known as:  ARICEPT  Take 10 mg by mouth daily.     LORazepam 0.5 MG tablet  Commonly known as:  ATIVAN  Take 0.5 mg by mouth 2 (two) times daily as needed for anxiety.     metoprolol tartrate 25 MG tablet  Commonly known as:   LOPRESSOR  Take 1 tablet (25 mg total) by mouth 2 (two) times daily.     multivitamin tablet  Take 1 tablet by mouth daily.     polymixin-bacitracin 500-10000 UNIT/GM Oint ointment  Apply 1 application topically 2 (two) times daily as needed (for skin irritations).     Rivaroxaban 15 MG Tabs tablet  Commonly known as:  XARELTO  Take 1 tablet (15 mg total) by mouth 2 (two) times daily with a meal.     vitamin C 500 MG tablet  Commonly known as:  ASCORBIC ACID  Take 500 mg by mouth daily.       Follow-up Information   Follow up with Lyda Jester, PA-C On 03/15/2013. (11:30 AM)    Specialty:  Cardiology   Contact information:   [address] [place] [address] [phone]      Greater than 30 minutes was spent completing the patient's discharge.    SignedTarri Fuller 02/28/2013, 11:56 AM

## 2013-02-28 NOTE — Progress Notes (Signed)
Patient was discharged to [facility] via [place]

## 2013-02-28 NOTE — Progress Notes (Addendum)
CSW received call from [facility] that ps if from [facility] unit. CSW called facility to verify pt is from facility and ask what they need for return. Facility asked discharge summary be hard-faxed to [phone] attn: Estill Bamberg. MD working on discharge summary now, and CSW will fax when it is complete. CSW will call RN after discharge packet is compiled and placed on chart. RN/CSW will call for PTAR. CSW signing off.  Addendum: CSW e-mailed discharge summary to admissions rep, as fax was busy and would not go through. Discharge packet placed on pt's chart.  Ky Barban, MSW, [facility] Clinical Social Worker [phone]

## 2013-03-02 LAB — CULTURE, BLOOD (ROUTINE X 2)
Culture: NO GROWTH
Culture: NO GROWTH

## 2013-03-05 ENCOUNTER — Ambulatory Visit: Payer: 59 | Admitting: Cardiology

## 2013-03-15 ENCOUNTER — Ambulatory Visit: Payer: 59 | Admitting: Cardiology

## 2013-03-15 ENCOUNTER — Ambulatory Visit (INDEPENDENT_AMBULATORY_CARE_PROVIDER_SITE_OTHER): Payer: 59 | Admitting: Cardiology

## 2013-03-15 VITALS — BP 110/60 | HR 77 | Ht 68.0 in

## 2013-03-15 DIAGNOSIS — I824Z9 Acute embolism and thrombosis of unspecified deep veins of unspecified distal lower extremity: Secondary | ICD-10-CM

## 2013-03-15 DIAGNOSIS — I824Z1 Acute embolism and thrombosis of unspecified deep veins of right distal lower extremity: Secondary | ICD-10-CM

## 2013-03-15 DIAGNOSIS — I4891 Unspecified atrial fibrillation: Secondary | ICD-10-CM

## 2013-03-15 DIAGNOSIS — I2589 Other forms of chronic ischemic heart disease: Secondary | ICD-10-CM

## 2013-03-15 NOTE — Patient Instructions (Signed)
Continue taking medications as prescribed. Follow up with Dr. Debara Pickett in 3 months.

## 2013-03-16 ENCOUNTER — Encounter: Payer: Self-pay | Admitting: Cardiology

## 2013-03-16 NOTE — Assessment & Plan Note (Signed)
Resume Xarelto for at least 6 months, perhaps longer as he has PAF.

## 2013-03-16 NOTE — Assessment & Plan Note (Signed)
In atrial fibrillation today. EKG demonstrates a controlled ventricular response with HR in the 70s. BP is stable and his is asymptomatic. Continue rate control with BID Lopressor. Resume Xarelto for anticoagulation.

## 2013-03-16 NOTE — Progress Notes (Signed)
Patient ID: Casey Moore, male   DOB: 1931/05/02, 80 y.o.   MRN: [id number]  03/16/2013 Casey Moore   09-Sep-1931  [id number]  Primary Physicia Casey Pao, MD Primary Cardiologist: Dr. Debara Moore  HPI:  Casey Moore is a 80 year old male with history of hypertension, Paget's disease and Alzheimer's dementia. He resides at an adult care facility. He was transported to [facility] on 02/23/13 after becoming SOB. On arrival to the ED, he was noted to be in atrial fibrillation with RVR. His blood pressure was low, systolics 78-29 and MAP around 55-65. His lactate and creatinine were elevated. It did not seem that he was altered beyond his baseline. He was given fluid boluses without improvement in his blood pressure. Given the signs of hypoperfusion the decision was made to cardiovert. He was sedated with fentanyl and versed and he received synchronized cardioversion at 200j. This was successful with sinus rhythm with some PACs and PVCs. His blood pressure still remained on the lower side. He mental status was about the same and his wife thought this was his baseline.   The patient was admitted. Amiodarone IV loading started. IV heparin started but he was ultimately changed to Xarelto. He reverted back to Afib. PCCM was consulted for help with management. Phenylephrine was started due to hypotension and DCd after two days. He was empirically started on Vancomycin, cefepime, azithromycin, Tamiflu. Blood/urine cultures were negative to date. Troponin peaked at 0.59. D-dimer was elevated at 9.74. Lower extremity venous doppler revealed an acute deep vein thrombosis involving the right popliteal, posterior tibial, and peroneal veins. An enlarged inguinal lymph node was noted on the right. Kidney US: Normal size kidneys with increased renal parenchymal echogenicity, consistent with medical renal disease. No evidence of hydronephrosis. Mildly distended urinary bladder. 2D Echo revealed an EF of 35-40%, possible  inferior hypokinesis(see full report below.) LFT's were elevated and thought to be shock liver. AlkP trended back down but AST/ALT remained mildly elevated. Follow up as OP. On 1/25 he had an episode of torsades which was asymptomatic. Magnesium was WNL. The patient continued to improve. Discussion of permanent Greenfield filter was had with the patient's wife. He was ultimately switched to rate control strategy with lopressor and amiodarone was stopped. The patient was seen by Dr. Debara Moore who felt he was stable for DC back to [place] unit with [place] PT and RN services.  He presents to clinic today for post hospital follow up. He is accompanied today by his wife. She is doing much of the talking due to his dementia. She states that she visits him daily and feels that he has been doing well. It has not appeared to her nor any of the staff at his care facility that he has been uncomfortable or SOB. He has not voiced any complaints. As far as she knows, he has been receiving all of his discharge meds at the care facility, including Lopressor and Xarelto.      Current Outpatient Prescriptions  Medication Sig Dispense Refill  . allopurinol (ZYLOPRIM) 300 MG tablet Take 300 mg by mouth daily.       Marland Kitchen atorvastatin (LIPITOR) 20 MG tablet Take 20 mg by mouth daily.       Marland Kitchen donepezil (ARICEPT) 10 MG tablet Take 10 mg by mouth daily.      Marland Kitchen LORazepam (ATIVAN) 0.5 MG tablet Take 0.5 mg by mouth 2 (two) times daily as needed for anxiety.      Marland Kitchen  metoprolol tartrate (LOPRESSOR) 25 MG tablet Take 1 tablet (25 mg total) by mouth 2 (two) times daily.  60 tablet  5  . Multiple Vitamin (MULTIVITAMIN) tablet Take 1 tablet by mouth daily.      . polymixin-bacitracin (POLYSPORIN) 500-10000 UNIT/GM OINT ointment Apply 1 application topically 2 (two) times daily as needed (for skin irritations).      . Rivaroxaban (XARELTO) 15 MG TABS tablet Take 1 tablet (15 mg total) by mouth 2 (two) times daily with  a meal.  30 tablet  5  . vitamin C (ASCORBIC ACID) 500 MG tablet Take 500 mg by mouth daily.       No current facility-administered medications for this visit.    No Known Allergies  History   Social History  . Marital Status: Married    Spouse Name: N/A    Number of Children: 3  . Years of Education: N/A   Occupational History  . Not on file.   Social History Main Topics  . Smoking status: Never Smoker   . Smokeless tobacco: Never Used  . Alcohol Use: Yes     Comment: socially  . Drug Use: No  . Sexual Activity: Not on file   Other Topics Concern  . Not on file   Social History Narrative  . No narrative on file     Review of Systems: General: negative for chills, fever, night sweats or weight changes.  Cardiovascular: negative for chest pain, dyspnea on exertion, edema, orthopnea, palpitations, paroxysmal nocturnal dyspnea or shortness of breath Dermatological: negative for rash Respiratory: negative for cough or wheezing Urologic: negative for hematuria Abdominal: negative for nausea, vomiting, diarrhea, bright red blood per rectum, melena, or hematemesis Neurologic: negative for visual changes, syncope, or dizziness All other systems reviewed and are otherwise negative except as noted above.    Blood pressure 110/60, pulse 77, height 5\' 8"  (1.727 m).  General appearance: alert, cooperative and no distress Lungs: clear to auscultation bilaterally Heart: irregularly irregular rhythm Extremities: no LEE Pulses: 2+ and symmetric Skin: warm and dry Neurologic: Mental status: advanced dementia at baseline  EKG Atrial fibrillation with a controlled ventricular response. HR in the 70s.  ASSESSMENT AND PLAN:   Atrial fibrillation In atrial fibrillation today. EKG demonstrates a controlled ventricular response with HR in the 70s. BP is stable and his is asymptomatic. Continue rate control with BID Lopressor. Resume Xarelto for anticoagulation.   Acute deep vein  thrombosis (DVT) of distal vein of right lower extremity Resume Xarelto for at least 6 months, perhaps longer as he has PAF.     PLAN  Continue current plan of care. F/u with Dr. Debara Moore in 2-3 months. Pt's wife instructed to follow-up sooner if he develops SOB, CP, dizziness, syncope/ near syncope or any signs of abnormal bleeding.   Arvilla Market 03/16/2013 3:44 PM

## 2013-04-29 ENCOUNTER — Emergency Department (HOSPITAL_COMMUNITY): Payer: 59

## 2013-04-29 ENCOUNTER — Inpatient Hospital Stay (HOSPITAL_COMMUNITY)
Admission: EM | Admit: 2013-04-29 | Discharge: 2013-05-03 | DRG: 064 | Disposition: A | Discharge status: Hospice | Payer: 59 | Attending: Internal Medicine | Admitting: Internal Medicine

## 2013-04-29 ENCOUNTER — Encounter (HOSPITAL_COMMUNITY): Payer: Self-pay | Admitting: Emergency Medicine

## 2013-04-29 DIAGNOSIS — M1610 Unilateral primary osteoarthritis, unspecified hip: Secondary | ICD-10-CM | POA: Diagnosis present

## 2013-04-29 DIAGNOSIS — Z79899 Other long term (current) drug therapy: Secondary | ICD-10-CM

## 2013-04-29 DIAGNOSIS — M109 Gout, unspecified: Secondary | ICD-10-CM | POA: Diagnosis present

## 2013-04-29 DIAGNOSIS — M889 Osteitis deformans of unspecified bone: Secondary | ICD-10-CM | POA: Diagnosis present

## 2013-04-29 DIAGNOSIS — L89209 Pressure ulcer of unspecified hip, unspecified stage: Secondary | ICD-10-CM | POA: Diagnosis present

## 2013-04-29 DIAGNOSIS — I4891 Unspecified atrial fibrillation: Secondary | ICD-10-CM | POA: Diagnosis present

## 2013-04-29 DIAGNOSIS — I609 Nontraumatic subarachnoid hemorrhage, unspecified: Principal | ICD-10-CM | POA: Diagnosis present

## 2013-04-29 DIAGNOSIS — M169 Osteoarthritis of hip, unspecified: Secondary | ICD-10-CM | POA: Diagnosis present

## 2013-04-29 DIAGNOSIS — N183 Chronic kidney disease, stage 3 unspecified: Secondary | ICD-10-CM | POA: Diagnosis present

## 2013-04-29 DIAGNOSIS — Z8546 Personal history of malignant neoplasm of prostate: Secondary | ICD-10-CM

## 2013-04-29 DIAGNOSIS — R0600 Dyspnea, unspecified: Secondary | ICD-10-CM

## 2013-04-29 DIAGNOSIS — R627 Adult failure to thrive: Secondary | ICD-10-CM | POA: Diagnosis present

## 2013-04-29 DIAGNOSIS — F0280 Dementia in other diseases classified elsewhere without behavioral disturbance: Secondary | ICD-10-CM | POA: Diagnosis present

## 2013-04-29 DIAGNOSIS — Z993 Dependence on wheelchair: Secondary | ICD-10-CM

## 2013-04-29 DIAGNOSIS — L89609 Pressure ulcer of unspecified heel, unspecified stage: Secondary | ICD-10-CM | POA: Diagnosis present

## 2013-04-29 DIAGNOSIS — L89109 Pressure ulcer of unspecified part of back, unspecified stage: Secondary | ICD-10-CM | POA: Diagnosis present

## 2013-04-29 DIAGNOSIS — I4510 Unspecified right bundle-branch block: Secondary | ICD-10-CM | POA: Diagnosis present

## 2013-04-29 DIAGNOSIS — G309 Alzheimer's disease, unspecified: Secondary | ICD-10-CM | POA: Diagnosis present

## 2013-04-29 DIAGNOSIS — L89899 Pressure ulcer of other site, unspecified stage: Secondary | ICD-10-CM | POA: Diagnosis present

## 2013-04-29 DIAGNOSIS — Z96659 Presence of unspecified artificial knee joint: Secondary | ICD-10-CM

## 2013-04-29 DIAGNOSIS — Z66 Do not resuscitate: Secondary | ICD-10-CM | POA: Diagnosis present

## 2013-04-29 DIAGNOSIS — Z515 Encounter for palliative care: Secondary | ICD-10-CM

## 2013-04-29 DIAGNOSIS — G9340 Encephalopathy, unspecified: Secondary | ICD-10-CM | POA: Diagnosis present

## 2013-04-29 DIAGNOSIS — I129 Hypertensive chronic kidney disease with stage 1 through stage 4 chronic kidney disease, or unspecified chronic kidney disease: Secondary | ICD-10-CM | POA: Diagnosis present

## 2013-04-29 DIAGNOSIS — K5730 Diverticulosis of large intestine without perforation or abscess without bleeding: Secondary | ICD-10-CM | POA: Diagnosis present

## 2013-04-29 DIAGNOSIS — E785 Hyperlipidemia, unspecified: Secondary | ICD-10-CM | POA: Diagnosis present

## 2013-04-29 DIAGNOSIS — Z8042 Family history of malignant neoplasm of prostate: Secondary | ICD-10-CM

## 2013-04-29 DIAGNOSIS — R29810 Facial weakness: Secondary | ICD-10-CM | POA: Diagnosis present

## 2013-04-29 DIAGNOSIS — J690 Pneumonitis due to inhalation of food and vomit: Secondary | ICD-10-CM | POA: Diagnosis present

## 2013-04-29 DIAGNOSIS — Z7901 Long term (current) use of anticoagulants: Secondary | ICD-10-CM

## 2013-04-29 DIAGNOSIS — L8990 Pressure ulcer of unspecified site, unspecified stage: Secondary | ICD-10-CM | POA: Diagnosis present

## 2013-04-29 DIAGNOSIS — R1310 Dysphagia, unspecified: Secondary | ICD-10-CM

## 2013-04-29 DIAGNOSIS — R569 Unspecified convulsions: Secondary | ICD-10-CM

## 2013-04-29 LAB — PHOSPHORUS: PHOSPHORUS: 3.3 mg/dL (ref 2.3–4.6)

## 2013-04-29 LAB — CBC
HEMATOCRIT: 31.6 % — AB (ref 39.0–52.0)
Hemoglobin: 10.3 g/dL — ABNORMAL LOW (ref 13.0–17.0)
MCH: 30 pg (ref 26.0–34.0)
MCHC: 32.6 g/dL (ref 30.0–36.0)
MCV: 92.1 fL (ref 78.0–100.0)
Platelets: 295 10*3/uL (ref 150–400)
RBC: 3.43 MIL/uL — AB (ref 4.22–5.81)
RDW: 15.2 % (ref 11.5–15.5)
WBC: 9.2 10*3/uL (ref 4.0–10.5)

## 2013-04-29 LAB — PROTIME-INR
INR: 2.68 — ABNORMAL HIGH (ref 0.00–1.49)
Prothrombin Time: 27.6 seconds — ABNORMAL HIGH (ref 11.6–15.2)

## 2013-04-29 LAB — COMPREHENSIVE METABOLIC PANEL
ALT: 20 U/L (ref 0–53)
AST: 26 U/L (ref 0–37)
Albumin: 2.3 g/dL — ABNORMAL LOW (ref 3.5–5.2)
Alkaline Phosphatase: 118 U/L — ABNORMAL HIGH (ref 39–117)
BILIRUBIN TOTAL: 0.3 mg/dL (ref 0.3–1.2)
BUN: 24 mg/dL — ABNORMAL HIGH (ref 6–23)
CALCIUM: 9 mg/dL (ref 8.4–10.5)
CHLORIDE: 98 meq/L (ref 96–112)
CO2: 16 meq/L — AB (ref 19–32)
Creatinine, Ser: 1.44 mg/dL — ABNORMAL HIGH (ref 0.50–1.35)
GFR, EST AFRICAN AMERICAN: 52 mL/min — AB (ref >90)
GFR, EST NON AFRICAN AMERICAN: 45 mL/min — AB (ref >90)
GLUCOSE: 123 mg/dL — AB (ref 70–99)
Potassium: 3.9 mEq/L (ref 3.7–5.3)
SODIUM: 138 meq/L (ref 137–147)
Total Protein: 6.3 g/dL (ref 6.0–8.3)

## 2013-04-29 LAB — I-STAT CHEM 8, ED
BUN: 22 mg/dL (ref 6–23)
CREATININE: 1.6 mg/dL — AB (ref 0.50–1.35)
Calcium, Ion: 1.19 mmol/L (ref 1.13–1.30)
Chloride: 102 mEq/L (ref 96–112)
Glucose, Bld: 123 mg/dL — ABNORMAL HIGH (ref 70–99)
HCT: 32 % — ABNORMAL LOW (ref 39.0–52.0)
HEMOGLOBIN: 10.9 g/dL — AB (ref 13.0–17.0)
Potassium: 3.7 mEq/L (ref 3.7–5.3)
SODIUM: 136 meq/L — AB (ref 137–147)
TCO2: 18 mmol/L (ref 0–100)

## 2013-04-29 LAB — I-STAT TROPONIN, ED: Troponin i, poc: 0.04 ng/mL (ref 0.00–0.08)

## 2013-04-29 LAB — DIFFERENTIAL
Basophils Absolute: 0 10*3/uL (ref 0.0–0.1)
Basophils Relative: 0 % (ref 0–1)
Eosinophils Absolute: 0.1 10*3/uL (ref 0.0–0.7)
Eosinophils Relative: 1 % (ref 0–5)
LYMPHS ABS: 1.8 10*3/uL (ref 0.7–4.0)
LYMPHS PCT: 20 % (ref 12–46)
MONO ABS: 0.8 10*3/uL (ref 0.1–1.0)
Monocytes Relative: 8 % (ref 3–12)
NEUTROS ABS: 6.5 10*3/uL (ref 1.7–7.7)
Neutrophils Relative %: 71 % (ref 43–77)

## 2013-04-29 LAB — AMMONIA: Ammonia: 18 umol/L (ref 11–60)

## 2013-04-29 LAB — MAGNESIUM: MAGNESIUM: 1.8 mg/dL (ref 1.5–2.5)

## 2013-04-29 LAB — APTT: aPTT: 54 seconds — ABNORMAL HIGH (ref 24–37)

## 2013-04-29 MED ORDER — SODIUM CHLORIDE 0.9 % IV SOLN
500.0000 mg | Freq: Two times a day (BID) | INTRAVENOUS | Status: DC
  Administered 2013-04-29 – 2013-05-01 (×4): 500 mg via INTRAVENOUS
  Filled 2013-04-29 (×5): qty 5

## 2013-04-29 MED ORDER — PROTHROMBIN COMPLEX CONC HUMAN 500 UNITS IV KIT
4196.0000 [IU] | PACK | Status: AC
  Administered 2013-04-29: 4196 [IU] via INTRAVENOUS
  Filled 2013-04-29: qty 168

## 2013-04-29 NOTE — ED Notes (Signed)
Pt returned from CT Dr. Doy Mince at bedside.

## 2013-04-29 NOTE — Consult Note (Addendum)
Referring Physician: Audie Pinto    Chief Complaint: Seizure  HPI: Casey Moore is an 80 y.o. male with a history of dementia and falls who was noted today at his facility to acutely begin flailing both arms and be unresponsive.  EMS was called at that time and the patient was felt to be post-ictal on their initial evaluation.  En route the patient had a generalized tonic clonic seizure.  Versed was administered.  Patient has remained unresponsive since that time.   At baseline the patient has been nonambulatory for about 2 months.  Is demented.    Date last known well: Date: 04/29/2013 Time last known well: Time: 19:00 tPA Given: No: Subarachnoid blood on CT, patient on Xarelto  Past Medical History  Diagnosis Date  . Osteoarthritis   . Hypertension   . Hx of adenomatous colonic polyps   . Diverticulosis   . Prostate cancer   . Paget's disease   . Kidney stone   . Sinusitis   . SDAT (senile dementia of Alzheimer's type)     Past Surgical History  Procedure Laterality Date  . Cataract extraction      left-with implant  . Total knee arthroplasty      bilateral  . Mastoid surgery      x 5  . Tonsillectomy and adenoidectomy    . Appendectomy    . Knee surgery      bilateral  . Prostatectomy    . Skin cancer excision      right hip    Family History  Problem Relation Age of Onset  . Colon cancer Neg Hx   . Prostate cancer Father   . Dementia Mother   . Throat cancer Paternal Grandfather   . Prostate cancer Brother    Social History:  reports that he has never smoked. He has never used smokeless tobacco. He reports that he drinks alcohol. He reports that he does not use illicit drugs.  Allergies: No Known Allergies  Medications: I have reviewed the patient's current medications. Prior to Admission:  Current outpatient prescriptions: allopurinol (ZYLOPRIM) 300 MG tablet, Take 300 mg by mouth daily. , Disp: , Rfl: ;   atorvastatin (LIPITOR) 20 MG tablet, Take 20 mg by  mouth daily. , Disp: , Rfl: ;   donepezil (ARICEPT) 10 MG tablet, Take 10 mg by mouth daily., Disp: , Rfl: ;   LORazepam (ATIVAN) 0.5 MG tablet, Take 0.5 mg by mouth 2 (two) times daily as needed for anxiety., Disp: , Rfl:  metoprolol tartrate (LOPRESSOR) 25 MG tablet, Take 1 tablet (25 mg total) by mouth 2 (two) times daily., Disp: 60 tablet, Rfl: 5;  Multiple Vitamin (MULTIVITAMIN) tablet, Take 1 tablet by mouth daily., Disp: , Rfl: ;   polymixin-bacitracin (POLYSPORIN) 500-10000 UNIT/GM OINT ointment, Apply 1 application topically 2 (two) times daily as needed (for skin irritations)., Disp: , Rfl:  Rivaroxaban (XARELTO) 15 MG TABS tablet, Take 1 tablet (15 mg total) by mouth 2 (two) times daily with a meal., Disp: 30 tablet, vitamin C (ASCORBIC ACID) 500 MG tablet, Take 500 mg by mouth daily., Disp: , Rfl:   ROS: History obtained from wife  General ROS: negative for - chills, fatigue, fever, night sweats, weight gain or weight loss Psychological ROS: negative for - behavioral disorder, hallucinations, memory difficulties, mood swings or suicidal ideation Ophthalmic ROS: negative for - blurry vision, double vision, eye pain or loss of vision ENT ROS: for the past week unable to figure out how  to eat and swallow Allergy and Immunology ROS: negative for - hives or itchy/watery eyes Hematological and Lymphatic ROS: negative for - bleeding problems, bruising or swollen lymph nodes Endocrine ROS: negative for - galactorrhea, hair pattern changes, polydipsia/polyuria or temperature intolerance Respiratory ROS: negative for - cough, hemoptysis, shortness of breath or wheezing Cardiovascular ROS: negative for - chest pain, dyspnea on exertion, edema or irregular heartbeat Gastrointestinal ROS: negative for - abdominal pain, diarrhea, hematemesis, nausea/vomiting or stool incontinence Genito-Urinary ROS: negative for - dysuria, hematuria, incontinence or urinary frequency/urgency Musculoskeletal ROS:  negative for - joint swelling or muscular weakness Neurological ROS: as noted in HPI Dermatological ROS: negative for rash and skin lesion changes  Physical Examination: Temperature 97.9 F (36.6 C), temperature source Tympanic, weight 84.4 kg (186 lb 1.1 oz).  Neurologic Examination: Mental Status: Patient does not respond to verbal stimuli.  Moves both upper extremities against gravity with deep sternal rub (right greater than left).  Does not follow commands.  No verbalizations noted.  Cranial Nerves: II: patient does not respond to confrontation bilaterally, pupils right 3 mm, left 3 mm,and reactive bilaterally III,IV,VI: doll's response present bilaterally.  V,VII: corneal reflex absent on the right  VIII: patient does not respond to verbal stimuli IX,X: gag reflex reduced, XI: trapezius strength unable to test bilaterally XII: tongue strength unable to test Motor: Moves both upper extremities against gravity with deep sternal rub.  Has spontaneous movement of the right lower extremity Sensory: Does not respond to noxious stimuli in any extremity. Deep Tendon Reflexes:  2+ in the upper extremities, absent in the lower extremities Plantars: mute bilaterally Cerebellar: Unable to perform    Laboratory Studies:  Basic Metabolic Panel:  Recent Labs Lab 04/29/13 2115 04/29/13 2132  NA 138 136*  K 3.9 3.7  CL 98 102  CO2 16*  --   GLUCOSE 123* 123*  BUN 24* 22  CREATININE 1.44* 1.60*  CALCIUM 9.0  --     Liver Function Tests:  Recent Labs Lab 04/29/13 2115  AST 26  ALT 20  ALKPHOS 118*  BILITOT 0.3  PROT 6.3  ALBUMIN 2.3*   No results found for this basename: LIPASE, AMYLASE,  in the last 168 hours No results found for this basename: AMMONIA,  in the last 168 hours  CBC:  Recent Labs Lab 04/29/13 2115 04/29/13 2132  WBC 9.2  --   NEUTROABS 6.5  --   HGB 10.3* 10.9*  HCT 31.6* 32.0*  MCV 92.1  --   PLT 295  --     Cardiac Enzymes: No results  found for this basename: CKTOTAL, CKMB, CKMBINDEX, TROPONINI,  in the last 168 hours  BNP: No components found with this basename: POCBNP,   CBG: No results found for this basename: GLUCAP,  in the last 168 hours  Microbiology: Results for orders placed during the hospital encounter of 02/23/13  MRSA PCR SCREENING     Status: None   Collection Time    02/24/13 12:12 AM      Result Value Ref Range Status   MRSA by PCR NEGATIVE  NEGATIVE Final   Comment:            The GeneXpert MRSA Assay (FDA     approved for NASAL specimens     only), is one component of a     comprehensive MRSA colonization     surveillance program. It is not     intended to diagnose MRSA     infection  nor to guide or     monitor treatment for     MRSA infections.  CULTURE, BLOOD (ROUTINE X 2)     Status: None   Collection Time    02/24/13  3:50 AM      Result Value Ref Range Status   Specimen Description BLOOD LEFT ANTECUBITAL   Final   Special Requests BOTTLES DRAWN AEROBIC ONLY 10CC   Final   Culture  Setup Time     Final   Value: 02/24/2013 14:42     Performed at [facility]   Culture     Final   Value: NO GROWTH 5 DAYS     Performed at [facility]   Report Status 03/02/2013 FINAL   Final  CULTURE, BLOOD (ROUTINE X 2)     Status: None   Collection Time    02/24/13  4:00 AM      Result Value Ref Range Status   Specimen Description BLOOD LEFT FOREARM   Final   Special Requests BOTTLES DRAWN AEROBIC ONLY 6CC   Final   Culture  Setup Time     Final   Value: 02/24/2013 14:43     Performed at [facility]   Culture     Final   Value: NO GROWTH 5 DAYS     Performed at [facility]   Report Status 03/02/2013 FINAL   Final  URINE CULTURE     Status: None   Collection Time    02/24/13  2:51 PM      Result Value Ref Range Status   Specimen Description URINE, CLEAN CATCH   Final   Special Requests Normal   Final   Culture  Setup Time     Final   Value: 02/24/2013  22:13     Performed at [facility] Count     Final   Value: NO GROWTH     Performed at [facility]   Culture     Final   Value: NO GROWTH     Performed at [facility]   Report Status 02/25/2013 FINAL   Final    Coagulation Studies:  Recent Labs  04/29/13 2115  LABPROT 27.6*  INR 2.68*    Urinalysis: No results found for this basename: COLORURINE, APPERANCEUR, LABSPEC, PHURINE, GLUCOSEU, HGBUR, BILIRUBINUR, KETONESUR, PROTEINUR, UROBILINOGEN, NITRITE, LEUKOCYTESUR,  in the last 168 hours  Lipid Panel: No results found for this basename: chol, trig, hdl, cholhdl, vldl, ldlcalc    HgbA1C:  No results found for this basename: HGBA1C    Urine Drug Screen:   No results found for this basename: labopia, cocainscrnur, labbenz, amphetmu, thcu, labbarb    Alcohol Level: No results found for this basename: ETH,  in the last 168 hours  Other results: EKG: atrial fibrillation at 77 bpm.  Imaging: Ct Head (brain) Wo Contrast  04/29/2013   CLINICAL DATA:  Code stroke. Report of seizure with postictal state.  EXAM: CT HEAD WITHOUT CONTRAST  TECHNIQUE: Contiguous axial images were obtained from the base of the skull through the vertex without intravenous contrast.  COMPARISON:  01/14/2013  FINDINGS: Skull and Sinuses:No acute findings. Bilateral mastoidectomy with aerated mastoid poles. Chronic sinusitis, especially of the left sphenoid. Nasal trumpet, imaged portions unremarkable.  On the sagittal scout image, there is a type 2 dens fracture with posterior displacement. Alignment is grossly to previous imaging.  Orbits: No acute abnormality.  Brain: There is small volume high  density abnormality in the left posterior frontal and parietal regions, presumably subarachnoid. Sulci in this region are effaced. There is no evidence of large territory cytotoxic edema. Extensive chronic small vessel disease with ischemic gliosis throughout the bilateral cerebral  white matter, with a frontal predilection. Ventriculomegaly which is ex vacuo in the setting of generalized brain atrophy.  Critical Value/emergent results were called by telephone at the time of interpretation on 04/29/2013 at 9:41 PM to Rapid Response RN, who verbally acknowledged these results and immediately relayed findings to Dr. Doy Mince.  IMPRESSION: 1. Small volume subarachnoid hemorrhage along the left cerebral convexity. Sulcal effacement in the posterior left cerebral hemisphere may reflect additional subarachnoid blood/debris or seizure related swelling. 2. Chronic type 2 dens fracture.   Electronically Signed   By: Jorje Guild M.D.   On: 04/29/2013 21:45    Assessment: 80 y.o. male presenting after his first seizure.  Patient is now sedated with Versed.  No clear evidence on examination of an acute infarct.  Head CT reviewed and shows a small area of hyperdensity in the left parietal region that likely represents SAH.  Patient on Xarelto.   Unclear if SAH is a consequence of the seizure or was spontaneous secondary to anticoagualtion and is what caused the seizure.  Further work up recommended.    Stroke Risk Factors - hypertension  Plan: 1. HgbA1c, fasting lipid panel, Magnesium, phosphorus, hepatic panel, ammonia 2. MRI, MRA  of the brain without contrast 3. PT consult, OT consult, Speech consult 4. Echocardiogram 5. Carotid dopplers 6. D/C Xarelto.  Reversal protocol to be initiated 7. Risk factor modification 8. Telemetry monitoring 9. Frequent neuro checks 10. EEG 11. Seizure precautions 12.  Ativan prn 13.  Last blood work in January showed elevated LFT's.  Creatinine elevated this evening as well.  Will start Keppra at 500mg  IV BID.   This patient is critically ill and at significant risk of neurological worsening, death and care requires constant monitoring of vital signs, hemodynamics,respiratory and cardiac monitoring, neurological assessment, discussion with family,  other specialists and medical decision making of high complexity. I spent 60 minutes of neurocritical care time  in the care of  this patient.  Alexis Goodell, MD [facility] [phone] 04/29/2013  10:39 PM   Alexis Goodell, MD [facility] [phone] 04/29/2013, 10:14 PM

## 2013-04-29 NOTE — ED Notes (Signed)
Pt remains unresponsive at this time.

## 2013-04-29 NOTE — ED Provider Notes (Signed)
CSN: [id number]     Arrival date & time 04/29/13  2114 History   First MD Initiated Contact with Patient 04/29/13 2124     Chief Complaint  Patient presents with  . Code Stroke     (Consider location/radiation/quality/duration/timing/severity/associated sxs/prior Treatment) HPI Comments: 80 y.o. WM w/ PMHx of dementia, Prostate Ca, HTN w/ cc: of AMS. At SNF suddenly started "flailing arms" and then became unresponsive. EMS called, and transported to ED. Initially protecting airway enroute but had multiple minute seizure en route and which broke with Versed. No hx of seizure. On arrival, obtunded, bagged for ventilation assistance, cannot obtain hx from patient. Family not available. Pt is DNR and has golden sheet with him.  Patient is a 80 y.o. male presenting with altered mental status. The history is provided by the patient.  Altered Mental Status Presenting symptoms: behavior changes, confusion and partial responsiveness   Severity:  Moderate Most recent episode:  Today Episode history:  Single Duration:  30 minutes Timing:  Constant Progression:  Unchanged Chronicity:  New Context: dementia and nursing home resident   Context: not drug use, not head injury, not a recent illness and not a recent infection   Associated symptoms: abnormal movement and seizures (had few minute GTC seizure en route but broken with Versed by EMS)   Associated symptoms: no difficulty breathing, no fever and no vomiting     Past Medical History  Diagnosis Date  . Osteoarthritis   . Hypertension   . Hx of adenomatous colonic polyps   . Diverticulosis   . Prostate cancer   . Paget's disease   . Kidney stone   . Sinusitis   . SDAT (senile dementia of Alzheimer's type)    Past Surgical History  Procedure Laterality Date  . Cataract extraction      left-with implant  . Total knee arthroplasty      bilateral  . Mastoid surgery      x 5  . Tonsillectomy and adenoidectomy    . Appendectomy    .  Knee surgery      bilateral  . Prostatectomy    . Skin cancer excision      right hip   Family History  Problem Relation Age of Onset  . Colon cancer Neg Hx   . Prostate cancer Father   . Dementia Mother   . Throat cancer Paternal Grandfather   . Prostate cancer Brother    History  Substance Use Topics  . Smoking status: Never Smoker   . Smokeless tobacco: Never Used  . Alcohol Use: Yes     Comment: socially    Review of Systems  Unable to perform ROS: Patient unresponsive  Constitutional: Negative for fever.  Gastrointestinal: Negative for vomiting.  Neurological: Positive for seizures (had few minute GTC seizure en route but broken with Versed by EMS).  Psychiatric/Behavioral: Positive for confusion.      Allergies  Review of patient's allergies indicates no known allergies.  Home Medications   Current Outpatient Rx  Name  Route  Sig  Dispense  Refill  . allopurinol (ZYLOPRIM) 300 MG tablet   Oral   Take 300 mg by mouth daily.         Marland Kitchen atorvastatin (LIPITOR) 20 MG tablet   Oral   Take 20 mg by mouth daily.         Marland Kitchen donepezil (ARICEPT) 10 MG tablet   Oral   Take 10 mg by mouth daily.         Marland Kitchen  liver oil-zinc oxide (DIAPER RASH) 40 % ointment   Topical   Apply 1 application topically as needed for irritation (for every diaper change).         . metoprolol tartrate (LOPRESSOR) 25 MG tablet   Oral   Take 1 tablet (25 mg total) by mouth 2 (two) times daily.   60 tablet   5   . Multiple Vitamin (MULTIVITAMIN) tablet   Oral   Take 1 tablet by mouth daily.         . vitamin C (ASCORBIC ACID) 500 MG tablet   Oral   Take 500 mg by mouth daily.         Marland Kitchen LORazepam (ATIVAN) 0.5 MG tablet   Oral   Take 0.5 mg by mouth 2 (two) times daily as needed for anxiety.          Temp(Src) 97.9 F (36.6 C) (Tympanic)  Wt 186 lb 1.1 oz (84.4 kg) Physical Exam  Vitals reviewed. Constitutional: He appears well-developed and well-nourished. He  appears distressed.  Elderly male in NAD, grimaces to pain, no verbal response, no purposeful movement.  HENT:  Head: Normocephalic and atraumatic.  Mouth/Throat: Oropharynx is clear and moist. No oropharyngeal exudate.  No sign of head trauma  Eyes: Conjunctivae are normal. Pupils are equal, round, and reactive to light. Right eye exhibits no discharge. Left eye exhibits no discharge. No scleral icterus.  Pupils 4 mm reactive, not moving eyes  Neck: Neck supple.  No C spine stepoffs or deformities  Cardiovascular: Normal rate, regular rhythm, normal heart sounds and intact distal pulses.  Exam reveals no gallop and no friction rub.   No murmur heard. Pulmonary/Chest: Breath sounds normal. He is in respiratory distress. He has no wheezes. He has no rales.  Maintaining sats with BVM, lungs clear and equal. Has effort, but decreased. Switched to NRB with sats maintained  Abdominal: Soft. He exhibits no distension and no mass. There is no tenderness.  Musculoskeletal: Normal range of motion.  Neurological: He exhibits abnormal muscle tone (hypotonia). GCS eye subscore is 2. GCS verbal subscore is 1. GCS motor subscore is 2.  Moving all extremities when woke up from versed, but sluggish, hypotonic initially, not oriented, decreased level of alertness  Skin: Skin is warm. No rash noted. He is not diaphoretic.    ED Course  Procedures (including critical care time) Labs Review Labs Reviewed  PROTIME-INR - Abnormal; Notable for the following:    Prothrombin Time 27.6 (*)    INR 2.68 (*)    All other components within normal limits  APTT - Abnormal; Notable for the following:    aPTT 54 (*)    All other components within normal limits  CBC - Abnormal; Notable for the following:    RBC 3.43 (*)    Hemoglobin 10.3 (*)    HCT 31.6 (*)    All other components within normal limits  COMPREHENSIVE METABOLIC PANEL - Abnormal; Notable for the following:    CO2 16 (*)    Glucose, Bld 123 (*)     BUN 24 (*)    Creatinine, Ser 1.44 (*)    Albumin 2.3 (*)    Alkaline Phosphatase 118 (*)    GFR calc non Af Amer 45 (*)    GFR calc Af Amer 52 (*)    All other components within normal limits  I-STAT CHEM 8, ED - Abnormal; Notable for the following:    Sodium 136 (*)    Creatinine,  Ser 1.60 (*)    Glucose, Bld 123 (*)    Hemoglobin 10.9 (*)    HCT 32.0 (*)    All other components within normal limits  DIFFERENTIAL  MAGNESIUM  PHOSPHORUS  AMMONIA  HEPARIN LEVEL (UNFRACTIONATED)  I-STAT TROPOININ, ED  CBG MONITORING, ED   Imaging Review Ct Head (brain) Wo Contrast  04/29/2013   CLINICAL DATA:  Code stroke. Report of seizure with postictal state.  EXAM: CT HEAD WITHOUT CONTRAST  TECHNIQUE: Contiguous axial images were obtained from the base of the skull through the vertex without intravenous contrast.  COMPARISON:  01/14/2013  FINDINGS: Skull and Sinuses:No acute findings. Bilateral mastoidectomy with aerated mastoid poles. Chronic sinusitis, especially of the left sphenoid. Nasal trumpet, imaged portions unremarkable.  On the sagittal scout image, there is a type 2 dens fracture with posterior displacement. Alignment is grossly to previous imaging.  Orbits: No acute abnormality.  Brain: There is small volume high density abnormality in the left posterior frontal and parietal regions, presumably subarachnoid. Sulci in this region are effaced. There is no evidence of large territory cytotoxic edema. Extensive chronic small vessel disease with ischemic gliosis throughout the bilateral cerebral white matter, with a frontal predilection. Ventriculomegaly which is ex vacuo in the setting of generalized brain atrophy.  Critical Value/emergent results were called by telephone at the time of interpretation on 04/29/2013 at 9:41 PM to Rapid Response RN, who verbally acknowledged these results and immediately relayed findings to Dr. Doy Mince.  IMPRESSION: 1. Small volume subarachnoid hemorrhage along the  left cerebral convexity. Sulcal effacement in the posterior left cerebral hemisphere may reflect additional subarachnoid blood/debris or seizure related swelling. 2. Chronic type 2 dens fracture.   Electronically Signed   By: Jorje Guild M.D.   On: 04/29/2013 21:45     EKG Interpretation   Date/Time:  Sunday April 29 2013 21:23:42 EDT Ventricular Rate:  77 PR Interval:  181 QRS Duration: 151 QT Interval:  431 QTC Calculation: 488 R Axis:   30 Text Interpretation:  Sinus rhythm Atrial premature complexes Right bundle  branch block Baseline wander in lead(s) V4 Abnormal ECG Confirmed by  BEATON  MD, ROBERT ([id number]) on 04/29/2013 11:22:13 PM      MDM   MDM: 80 y.o. WM w/ PMHx of dementia, HTN, Prostate ca w/ cc: of unresponsive. Pt initially a code stroke by EMS but rescinded. At SNF, suddenly had apparent seizure, was post ictal. Enroute had another GTC seizure and brokenw ith Versed. On arrival, AFVSS, but dec level of responsiveness. GCS 5, non protectin gairway but DNR so we respected his wishes. Maintaining sats with NRB. Neurology at bedside as notified of code stroke. Head CT obtained, labs obtained. EKG no obvious ischemic change. Head CT shows small SAH. Pt is on Rivaroxiban for unknown reason. Labs as above. With patient with ICH, symptomatic, on Xarelto, d/w Dr. Doy Mince of Neurology and will reverse after family discussion. Kcentra ordered per protocol. Neurology recommends further workup. Pt to be admitted to medicine stepdown for further care. Pt remained HDS while in ED. Not protecting airway initially but DNR/DNI. Sats maintained with NRB. Care of case d/w my attending.  Final diagnoses:  Seizure  SAH (subarachnoid hemorrhage)   Admit   Sol Passer, MD 04/30/13 [phone]

## 2013-04-29 NOTE — Code Documentation (Signed)
80 yo wm brought in via [facility] for new onset seizures after witnessed episode of ALOC.  Pt seized for EMS and was given versed 5mg  IV to break sz.  Per family pt is a DNR.  See doc flowsheet for code stroke times.  Unable to complete NIH due to sedation.  Code stroke cancelled by Dr. Doy Mince.

## 2013-04-29 NOTE — ED Notes (Signed)
Pt's wife at bedside talking with Dr. Doy Mince at this time.

## 2013-04-30 ENCOUNTER — Inpatient Hospital Stay (HOSPITAL_COMMUNITY): Payer: 59

## 2013-04-30 DIAGNOSIS — I609 Nontraumatic subarachnoid hemorrhage, unspecified: Principal | ICD-10-CM | POA: Diagnosis present

## 2013-04-30 LAB — GLUCOSE, CAPILLARY
GLUCOSE-CAPILLARY: 95 mg/dL (ref 70–99)
GLUCOSE-CAPILLARY: 91 mg/dL (ref 70–99)
GLUCOSE-CAPILLARY: 75 mg/dL (ref 70–99)
GLUCOSE-CAPILLARY: 82 mg/dL (ref 70–99)
Glucose-Capillary: 74 mg/dL (ref 70–99)
Glucose-Capillary: 83 mg/dL (ref 70–99)
Glucose-Capillary: 78 mg/dL (ref 70–99)

## 2013-04-30 LAB — MRSA PCR SCREENING: MRSA by PCR: NEGATIVE

## 2013-04-30 LAB — HEPARIN LEVEL (UNFRACTIONATED): Heparin Unfractionated: >2.2 IU/mL — ABNORMAL HIGH (ref 0.30–0.70)

## 2013-04-30 MED ORDER — METOPROLOL TARTRATE 1 MG/ML IV SOLN: 5.0000 mg | INTRAVENOUS | Status: DC | PRN

## 2013-04-30 MED ORDER — ACETAMINOPHEN 325 MG PO TABS: 650.0000 mg | ORAL_TABLET | Freq: Four times a day (QID) | ORAL | Status: DC | PRN

## 2013-04-30 MED ORDER — COLLAGENASE 250 UNIT/GM EX OINT
TOPICAL_OINTMENT | Freq: Every day | CUTANEOUS | Status: DC
Start: 2013-04-30 — End: 2013-05-03
  Administered 2013-05-01 – 2013-05-03 (×3): via TOPICAL
  Filled 2013-04-30: qty 30

## 2013-04-30 MED ORDER — SODIUM CHLORIDE 0.9 % IV SOLN
INTRAVENOUS | Status: DC
  Administered 2013-04-30 (×2): via INTRAVENOUS

## 2013-04-30 MED ORDER — PIPERACILLIN-TAZOBACTAM 3.375 G IVPB
3.3750 g | Freq: Three times a day (TID) | INTRAVENOUS | Status: DC
  Administered 2013-04-30 – 2013-05-02 (×7): 3.375 g via INTRAVENOUS
  Filled 2013-04-30 (×9): qty 50

## 2013-04-30 MED ORDER — LORAZEPAM 2 MG/ML IJ SOLN
1.0000 mg | INTRAMUSCULAR | Status: DC | PRN
Start: 2013-04-30 — End: 2013-05-01

## 2013-04-30 MED ORDER — ACETAMINOPHEN 650 MG RE SUPP: 650.0000 mg | Freq: Four times a day (QID) | RECTAL | Status: DC | PRN

## 2013-04-30 MED ORDER — SODIUM CHLORIDE 0.9 % IJ SOLN
3.0000 mL | Freq: Two times a day (BID) | INTRAMUSCULAR | Status: DC
  Administered 2013-04-30 – 2013-05-03 (×5): 3 mL via INTRAVENOUS

## 2013-04-30 NOTE — Procedures (Signed)
EEG report.  Brief clinical history: 80 y.o. male presenting after his first seizure    Technique: this is a 17 channel routine scalp EEG performed at the bedside with bipolar and monopolar montages arranged in accordance to the international 10/20 system of electrode placement. One channel was dedicated to EKG recording.  Patient is unresponsive during the test.  Description:.as the study begins and throughout the entire recording, there is generalized, continuous, monomorphic, no reactive theta slowing that doesn't follow an ictal pattern at any time.  No focal or generalized epileptiform discharges noted.  No electrographic seizures seen  No focal or generalized epileptiform discharges noted.  EKG showed sinus rhythm.  Impression: this is an abnormal EEG with findings consistent with a moderate encephalopathy, non specific as to cause. No electrographic seizures noted. Clinical correlation is advised.  Dorian Pod, MD

## 2013-04-30 NOTE — Progress Notes (Signed)
Pharmacy Consult - Zosyn  Aspiration pneumonia CrCl > 20 ml / min  Plan: 1) Zosyn 3.375 grams iv Q 8 hours - 4 hr infusion 2) Pharmacy to sign off -- re-consult if needed  Thank you. Anette Guarneri, PharmD [phone]

## 2013-04-30 NOTE — Progress Notes (Addendum)
Patient transported from ER by ER RN and NT via stretcher. Patient on 100% NRB, not responsive to commands, withdraws from pain. Right nare nasal trumpet intact. No family at bedside. Will continue to monitor.

## 2013-04-30 NOTE — H&P (Signed)
PCP:   Haywood Pao, MD   Chief Complaint:  Unresponsive but diagnosed with apparent seizure activity and arachnoid hemorrhage  HPI: 80 year old gentleman who is married, but resides at some sort of assisted living/independent living facility with the aid of home health, who has a past medical history significant for atrial fibrillation with rapid ventricular response, on anticoagulation was a relative, severe dementia, essentially being wheelchair ridden the last several months, complicated further by hyperlipidemia, gout, osteoarthritis chronic kidney disease stage III Paget's disease hypertension and a known history of right bundle branch block and decubital ulcers on the lower extremities. He was in this facility today noted to be flailing, and unresponsive, EMS called, thought to have seizure activity given Versed after suffering a tonic-clonic seizure in route to the emergency room. Unresponsive in the emergency room, code stroke called, evaluated by Dr. Doy Mince with the idea that he is presenting for his first seizure now sedated with Versed and no clear examination consistent with an acute and 4 however head CT showing an area likely representing a subarachnoid hemorrhage on anticoagulation. Unclear whether subarachnoid hemorrhage occurred as a consequence of the seizure or secondary to anticoagulation and that causing the seizure. Recommending further workup, this was discussed with the wife who is now absent from the emergency room upon my arrival. Brief review of the last office visit note by patient's primary care provider reveals that the patient's baseline quality of life is quite poor with significant dementia, transported in a wheelchair, with progressive decline. Patient apparently is DO NOT RESUSCITATE DO NOT INTUBATE her emergency room physician after consultation with wife prior to her leaving the emergency room.  Review of Systems:  Unobtainable from the patient and significant  lethargy however per neurology note from the wife-see that note for details however negative for fevers chills and April disturbances negative for respiratory distress, chest pain on change in bowel habits unclear if there is a been any recent falls the patient does have a history of falls.  Past Medical History: Past Medical History  Diagnosis Date  . Osteoarthritis   . Hypertension   . Hx of adenomatous colonic polyps   . Diverticulosis   . Prostate cancer   . Paget's disease   . Kidney stone   . Sinusitis   . SDAT (senile dementia of Alzheimer's type)    Past Surgical History  Procedure Laterality Date  . Cataract extraction      left-with implant  . Total knee arthroplasty      bilateral  . Mastoid surgery      x 5  . Tonsillectomy and adenoidectomy    . Appendectomy    . Knee surgery      bilateral  . Prostatectomy    . Skin cancer excision      right hip    Medications: Prior to Admission medications   Medication Sig Start Date End Date Taking? Authorizing Provider  allopurinol (ZYLOPRIM) 300 MG tablet Take 300 mg by mouth daily.   Yes Historical Provider, MD  atorvastatin (LIPITOR) 20 MG tablet Take 20 mg by mouth daily.   Yes Historical Provider, MD  donepezil (ARICEPT) 10 MG tablet Take 10 mg by mouth daily.   Yes Historical Provider, MD  liver oil-zinc oxide (DIAPER RASH) 40 % ointment Apply 1 application topically as needed for irritation (for every diaper change).   Yes Historical Provider, MD  metoprolol tartrate (LOPRESSOR) 25 MG tablet Take 1 tablet (25 mg total) by mouth 2 (two) times  daily. 02/28/13  Yes Tarri Fuller, PA-C  Multiple Vitamin (MULTIVITAMIN) tablet Take 1 tablet by mouth daily.   Yes Historical Provider, MD  vitamin C (ASCORBIC ACID) 500 MG tablet Take 500 mg by mouth daily.   Yes Historical Provider, MD  LORazepam (ATIVAN) 0.5 MG tablet Take 0.5 mg by mouth 2 (two) times daily as needed for anxiety.    Historical Provider, MD    Allergies:   No Known Allergies  Social History:  reports that he has never smoked. He has never used smokeless tobacco. He reports that he drinks alcohol. He reports that he does not use illicit drugs. Married 3 children  Family History: Family History  Problem Relation Age of Onset  . Colon cancer Neg Hx   . Prostate cancer Father   . Dementia Mother   . Throat cancer Paternal Grandfather   . Prostate cancer Brother     Physical Exam: Filed Vitals:   04/29/13 2138  Temp: 97.9 F (36.6 C)  TempSrc: Tympanic  Weight: 84.4 kg (186 lb 1.1 oz)   General appearance-well-nourished lying in bed with facemask in place, unresponsive to verbal stimuli does not follow commands No evidence of head trauma No evidence of head trauma Sclera anicteric, face is symmetric  His Ultram but in place No apparent oropharyngeal lesions Neck supple, no cervical lymphadenopathy Lungs reveal diffuse rhonchi but no respiratory distress on oxygen iiregularly irregular rhythm somewhat distant heart sounds Abdomen soft, nontender, nondistended bowel sounds present Evaluation of extremities does not reveal any evidence of laceration or trauma Trace edema bilateral lower chest remedies, pedal pulses intact, no cyanosis, bandaging over bilateral heels Neurologic exam view from neurology consult note with barely perceptible movement with sternal rub, reviewed    Labs on Admission:   Recent Labs  04/29/13 2115 04/29/13 2132 04/29/13 2301  NA 138 136*  --   K 3.9 3.7  --   CL 98 102  --   CO2 16*  --   --   GLUCOSE 123* 123*  --   BUN 24* 22  --   CREATININE 1.44* 1.60*  --   CALCIUM 9.0  --   --   MG  --   --  1.8  PHOS  --   --  3.3    Recent Labs  04/29/13 2115  AST 26  ALT 20  ALKPHOS 118*  BILITOT 0.3  PROT 6.3  ALBUMIN 2.3*   No results found for this basename: LIPASE, AMYLASE,  in the last 72 hours  Recent Labs  04/29/13 2115 04/29/13 2132  WBC 9.2  --   NEUTROABS 6.5  --   HGB  10.3* 10.9*  HCT 31.6* 32.0*  MCV 92.1  --   PLT 295  --    No results found for this basename: CKTOTAL, CKMB, CKMBINDEX, TROPONINI,  in the last 72 hours No results found for this basename: TSH, T4TOTAL, FREET3, T3FREE, THYROIDAB,  in the last 72 hours No results found for this basename: VITAMINB12, FOLATE, FERRITIN, TIBC, IRON, RETICCTPCT,  in the last 72 hours  Radiological Exams on Admission: Ct Head (brain) Wo Contrast  04/29/2013   CLINICAL DATA:  Code stroke. Report of seizure with postictal state.  EXAM: CT HEAD WITHOUT CONTRAST  TECHNIQUE: Contiguous axial images were obtained from the base of the skull through the vertex without intravenous contrast.  COMPARISON:  01/14/2013  FINDINGS: Skull and Sinuses:No acute findings. Bilateral mastoidectomy with aerated mastoid poles. Chronic sinusitis, especially of the  left sphenoid. Nasal trumpet, imaged portions unremarkable.  On the sagittal scout image, there is a type 2 dens fracture with posterior displacement. Alignment is grossly to previous imaging.  Orbits: No acute abnormality.  Brain: There is small volume high density abnormality in the left posterior frontal and parietal regions, presumably subarachnoid. Sulci in this region are effaced. There is no evidence of large territory cytotoxic edema. Extensive chronic small vessel disease with ischemic gliosis throughout the bilateral cerebral white matter, with a frontal predilection. Ventriculomegaly which is ex vacuo in the setting of generalized brain atrophy.  Critical Value/emergent results were called by telephone at the time of interpretation on 04/29/2013 at 9:41 PM to Rapid Response RN, who verbally acknowledged these results and immediately relayed findings to Dr. Doy Mince.  IMPRESSION: 1. Small volume subarachnoid hemorrhage along the left cerebral convexity. Sulcal effacement in the posterior left cerebral hemisphere may reflect additional subarachnoid blood/debris or seizure related  swelling. 2. Chronic type 2 dens fracture.   Electronically Signed   By: Jorje Guild M.D.   On: 04/29/2013 21:45   Orders placed during the hospital encounter of 04/29/13  . ED EKG  . ED EKG  . EKG 12-LEAD  . EKG 12-LEAD    Assessment/Plan Active Problems:   Seizure started on Keppra 500 mg IV twice a day by neurology   SAH (subarachnoid hemorrhage)-reversal protocol for anticoagulation ordered by neurology, further request will monitor on telemetry, with frequent neurochecks and seizure precautions Chronic kidney disease-will provide IV fluids and monitor with urine output looking for evidence of volume overload End-stage dementia-with questionable quality of life, apparently progressive over the last few months, neurology recommending aggressive workup to include MRI/MRA/echocardiogram, carotid Dopplers, EEG, as well as PT OT and speech consult-wife unavailable at this time however question the utility and and whether this will change care or outcomes in this patient, will defer to primary care physician in the morning-Will admit to step down however hopefully will have quick transition to the floor once management parameters established Atrial fibrillation with history of rapid ventricular response-relatively well rate controlled, we'll order beta blocker when necessary Hypertension-hemodynamically stable with no evidence of hypotension or significant hypertension at this time Pagets disease-with slightly elevated alkaline phosphatase and history of bisphosphonate? Will defer to PCP  [place] arthritis no active synovitis on exam, history of gout Heel decubital ulcers-will monitor conservative DO NOT RESUSCITATE/DO NOT INTUBATE per family upon admission to the emergency room  Kyrstal Monterrosa R 04/30/2013, 12:00 AM

## 2013-04-30 NOTE — Consult Note (Addendum)
WOC wound consult note Reason for Consult: Consult requested for multiple wounds.  Pt has multiple systemic factors which might impair healing including: immobile, incontinent, decreased nutrition. Wound type: Left hip unstageable: 6.5X6.5cm, 100% soft eschar, small amt tan drainage, some odor. Right hip unstageable: 2.5X1.5cm, 100% dry eschar without odor or drainage. Sacrum with stage 2:2X2X.1cm pink and moist. No odor, small amt yellow drainage. Surrounded by dark purple deep tissue injuries on both sides, 2X2cm, and patchy areas of partial thickness skin loss pink and moist; appearance consistent with moisture associated skin damage. Left heel unstageable: 2X1.5cm, 50% red, 50% soft eschar.  No odor, small amt tan drainage. Right heel unstageable: 2X1.5cm, 80% yellow, 20% red, no odor, small amt tan drainage. Left second toe with full thickness wound; .5X.5X.1cm, 100% red and moist. Pressure Ulcer POA: Yes to all locations Dressing procedure/placement/frequency: Float heels at all times to reduce pressure.  Santyl ointment to chemically debride nonviable tissue to bilat heels and bilat hips.  Foam dressing to protect and promote healing to toe and sacrum/buttocks.  Swallowing assessment pending to optimize nutrition. Please re-consult if further assistance is needed.  Thank-you,  Julien Girt MSN, [place], [place], [place], [place]

## 2013-04-30 NOTE — Progress Notes (Signed)
INITIAL NUTRITION ASSESSMENT  DOCUMENTATION CODES Per approved criteria  -Not Applicable   INTERVENTION: Recommend SLP evaluation to determine most appropriate diet texture and liquid consistency. Pt would benefit from oral nutrition supplements once diet has advanced to maximize wound healing. RD to continue to follow nutrition care plan.  NUTRITION DIAGNOSIS: Inadequate oral intake related to inability to eat as evidenced by NPO status.   Goal: Diet advancement. Intake to meet >90% of estimated nutrition needs.  Monitor:  weight trends, lab trends, I/O's, diet advancement vs initiation of nutrition support  Reason for Assessment: Low Braden Score  80 y.o. male  Admitting Dx: SAH  ASSESSMENT: PMHx significant for afib, severe dementia, wheelchair bound x several months, HLD, gout, osteoarthritis, CKD stage III. Admitted after being found unresponsive at facility. En route to hospital, pt had generalized tonic clonic seizure. Work-up reveals SAH.  EEG pending.  RD obtained nutrition hx from wife at bedside. Patient with poor po intake for the past week. Wife has noticed pt has had difficulty swallowing over the past few days. Wife reports that pt has lost a lot of weight within the past 4 months. She states that his weight prior to weight loss was 185 lb, however this is consistent with his current weight. She states that he was on a Heart Healthy diet PTA at facility.  Nutrition Focused Physical Exam:  Subcutaneous Fat:  Orbital Region: WNL Upper Arm Region: n/a Thoracic and Lumbar Region: WNL  Muscle:  Temple Region: WNL Clavicle Bone Region: WNL Clavicle and Acromion Bone Region: WNL Scapular Bone Region: n/a Dorsal Hand: n/a Patellar Region: moderate depletion Anterior Thigh Region: severe depletion Posterior Calf Region: n/a  Edema: trace  Patient is at nutrition risk 2/2 chronic medical issues and significant wounds.  Receiving IVF of NS at 75  ml/hr.  Height: Ht Readings from Last 1 Encounters:  04/30/13 6' (1.829 m)    Weight: Wt Readings from Last 1 Encounters:  04/30/13 183 lb 6.8 oz (83.2 kg)    Ideal Body Weight: 178 lb/80.9 kg  % Ideal Body Weight: 103%  Wt Readings from Last 10 Encounters:  04/30/13 183 lb 6.8 oz (83.2 kg)  02/25/13 186 lb 1.1 oz (84.4 kg)  01/14/13 190 lb 14.7 oz (86.6 kg)  12/01/10 198 lb (89.812 kg)  11/03/10 198 lb 3.2 oz (89.903 kg)    Usual Body Weight: n/a  % Usual Body Weight: n/a  BMI:  Body mass index is 24.87 kg/(m^2). WNL  Estimated Nutritional Needs: Kcal: 1700 - 1900 Protein: > 100 g daily Fluid: approx 2 liters daily  Skin:  unstageable R hip unstageable L hip Stage II sacrum Stage II L 2nd toe Stage III R heel Stage III L heel  Diet Order: NPO  EDUCATION NEEDS: -No education needs identified at this time   Intake/Output Summary (Last 24 hours) at 04/30/13 1128 Last data filed at 04/30/13 0800  Gross per 24 hour  Intake    375 ml  Output    575 ml  Net   -200 ml    Last BM: PTA  Labs:   Recent Labs Lab 04/29/13 2115 04/29/13 2132 04/29/13 2301  NA 138 136*  --   K 3.9 3.7  --   CL 98 102  --   CO2 16*  --   --   BUN 24* 22  --   CREATININE 1.44* 1.60*  --   CALCIUM 9.0  --   --   MG  --   --  1.8  PHOS  --   --  3.3  GLUCOSE 123* 123*  --    No results found for this basename: HGBA1C    CBG (last 3)   Recent Labs  04/30/13 0232 04/30/13 0409 04/30/13 0813  GLUCAP 95 91 74    Scheduled Meds: . levETIRAcetam  500 mg Intravenous Q12H  . piperacillin-tazobactam (ZOSYN)  IV  3.375 g Intravenous Q8H  . sodium chloride  3 mL Intravenous Q12H    Continuous Infusions: . sodium chloride 75 mL/hr at 04/30/13 0230    Past Medical History  Diagnosis Date  . Osteoarthritis   . Hypertension   . Hx of adenomatous colonic polyps   . Diverticulosis   . Prostate cancer   . Paget's disease   . Kidney stone   . Sinusitis   .  SDAT (senile dementia of Alzheimer's type)     Past Surgical History  Procedure Laterality Date  . Cataract extraction      left-with implant  . Total knee arthroplasty      bilateral  . Mastoid surgery      x 5  . Tonsillectomy and adenoidectomy    . Appendectomy    . Knee surgery      bilateral  . Prostatectomy    . Skin cancer excision      right hip    Inda Coke MS, RD, LDN Inpatient Registered Dietitian Pager: [phone] After-hours pager: [phone]

## 2013-04-30 NOTE — Progress Notes (Signed)
EEG completed; results pending.    

## 2013-04-30 NOTE — Progress Notes (Signed)
Subjective: Patient wakes with stimuli, he is disoriented at baseline due to progressive dementia.  Wife not present this AM.  Objective: Vital signs in last 24 hours: Temp:  [97.5 F (36.4 C)-97.9 F (36.6 C)] 97.6 F (36.4 C) (03/30 0409) Pulse Rate:  [69-78] 69 (03/30 0409) Resp:  [18-23] 23 (03/30 0409) BP: (122-123)/(74-77) 123/74 mmHg (03/30 0409) SpO2:  [100 %] 100 % (03/30 0700) FiO2 (%):  [50 %-100 %] 50 % (03/30 0700) Weight:  [83.2 kg (183 lb 6.8 oz)-84.4 kg (186 lb 1.1 oz)] 83.2 kg (183 lb 6.8 oz) (03/30 0225) Weight change:  Last BM Date:  (unknown- PTA)  Intake/Output from previous day: 03/29 0701 - 03/30 0700 In: 375 [I.V.:375] Out: -  Intake/Output this shift:   General appearance-well-nourished lying in bed with facemask in place No evidence of head trauma  Sclera anicteric, face is symmetric  Neck supple, no cervical lymphadenopathy  Lungs reveal diffuse rhonchi but no respiratory distress on oxygen  Heart: iiregularly irregular rhythm c/w afibAbdomen soft, nontender, nondistended bowel sounds present  Trace edema bilateral LE's Neurologic exam:  Slow to respond, no lateralizing signs, severe dementia at baseline.   Lab Results:  Recent Labs  04/29/13 2115 04/29/13 2132  WBC 9.2  --   HGB 10.3* 10.9*  HCT 31.6* 32.0*  PLT 295  --    BMET  Recent Labs  04/29/13 2115 04/29/13 2132  NA 138 136*  K 3.9 3.7  CL 98 102  CO2 16*  --   GLUCOSE 123* 123*  BUN 24* 22  CREATININE 1.44* 1.60*  CALCIUM 9.0  --     Studies/Results: Ct Head (brain) Wo Contrast  04/29/2013   CLINICAL DATA:  Code stroke. Report of seizure with postictal state.  EXAM: CT HEAD WITHOUT CONTRAST  TECHNIQUE: Contiguous axial images were obtained from the base of the skull through the vertex without intravenous contrast.  COMPARISON:  01/14/2013  FINDINGS: Skull and Sinuses:No acute findings. Bilateral mastoidectomy with aerated mastoid poles. Chronic sinusitis, especially of  the left sphenoid. Nasal trumpet, imaged portions unremarkable.  On the sagittal scout image, there is a type 2 dens fracture with posterior displacement. Alignment is grossly to previous imaging.  Orbits: No acute abnormality.  Brain: There is small volume high density abnormality in the left posterior frontal and parietal regions, presumably subarachnoid. Sulci in this region are effaced. There is no evidence of large territory cytotoxic edema. Extensive chronic small vessel disease with ischemic gliosis throughout the bilateral cerebral white matter, with a frontal predilection. Ventriculomegaly which is ex vacuo in the setting of generalized brain atrophy.  Critical Value/emergent results were called by telephone at the time of interpretation on 04/29/2013 at 9:41 PM to Rapid Response RN, who verbally acknowledged these results and immediately relayed findings to Dr. Doy Mince.  IMPRESSION: 1. Small volume subarachnoid hemorrhage along the left cerebral convexity. Sulcal effacement in the posterior left cerebral hemisphere may reflect additional subarachnoid blood/debris or seizure related swelling. 2. Chronic type 2 dens fracture.   Electronically Signed   By: Jorje Guild M.D.   On: 04/29/2013 21:45   Dg Chest Port 1 View  04/30/2013   CLINICAL DATA:  Hypoxia, abnormal breath sounds  EXAM: PORTABLE CHEST - 1 VIEW  COMPARISON:  02/25/2013  FINDINGS: Overall low lung volumes increased diffuse left lung patchy opacity concerning for developing pneumonia or aspiration. Minor right mid lung atelectasis along the minor fissure. No enlarging effusion or pneumothorax. Stable mild cardiomegaly with normal vascularity.  Atherosclerosis of the aorta. Degenerative changes of the spine. Exam is rotated to the left.  IMPRESSION: Worsening patchy left lung airspace opacities concerning for atelectasis/ pneumonia. Aspiration not excluded.  Minor right mid lung atelectasis  Persistent low lung volumes   Electronically  Signed   By: Daryll Brod M.D.   On: 04/30/2013 07:52    Medications:  I have reviewed the patient's current medications. Scheduled: . levETIRAcetam  500 mg Intravenous Q12H  . sodium chloride  3 mL Intravenous Q12H   Continuous: . sodium chloride 75 mL/hr at 04/30/13 0230   VGC:[id number], acetaminophen, LORazepam, metoprolol  Assessment/Plan: 1) Subarachnoid hemorrhage.  He had been started on Xarelto the end of January by Cards during an admission for afib/RVR with a DVT being found.  No evidence of trauma at this point, but his ambulation is poor as noted from his last office visit.  Prior, he was wheelchair bound and has had decubiti as well.  He has progressive dementia and is disoriented, which led to his placement at his current facility due to wife's continued work and [employer] needing 24 hour supervision.  Given these, I think that an aggressive risk factor workup to prevent next events is unnecessary.  I agree with EEG to confirm seizure and may help Neuro consult decide on antiseizure meds going forward.  Nothing is to be gained from MRI/Echo (known afib, cannot take anticoagulants further due to this event) or other metabolic workups.  2)  Afib/RVR  IV metoprolol for rate control 3) Aspiration PNA- Zosyn per pharmacy.  Remains NPO and will have more workup for swallowing safety (which he may very well fail). 4) Paget's  Not on bisphosphonate due to being at facility, mild elevation of alk phos consistent with his long term disease. 5) Prostate CA- Not an issue at current, will watch voiding however. 6) SDAT-  At his baseline, he cannot recognize me but is very good at confabulation.  His overall prognosis based even solely on his progression of issues related to general health deterioration while at facility is poor.  He is now DNR/DNI.  I completely agree with this, and he may also be a Hospice candidate.  Too soon to tell what his new baseline is, but I feel that if he enrolls in  Hospice, this will also be beneficial to Hilda Blades, his wife.  Plan today:  As above, will attempt to contact his wife later this AM (she was not present at Dr. Danna Hefty admission as well) to review plan of action.   LOS: 1 day   Casey Moore W 04/30/2013, 8:02 AM

## 2013-04-30 NOTE — Progress Notes (Signed)
Utilization review completed.  

## 2013-04-30 NOTE — Progress Notes (Signed)
NEURO HOSPITALIST PROGRESS NOTE   SUBJECTIVE:                                                                                                                        Patient does not respond to voice but localizes to pain. Moving bilateral UE with good strength.    OBJECTIVE:                                                                                                                           Vital signs in last 24 hours: Temp:  [97.5 F (36.4 C)-98.3 F (36.8 C)] 98.3 F (36.8 C) (03/30 0700) Pulse Rate:  [67-78] 67 (03/30 0703) Resp:  [18-23] 19 (03/30 0703) BP: (107-123)/(69-77) 107/69 mmHg (03/30 0703) SpO2:  [100 %] 100 % (03/30 0703) FiO2 (%):  [50 %-100 %] 50 % (03/30 0700) Weight:  [83.2 kg (183 lb 6.8 oz)-84.4 kg (186 lb 1.1 oz)] 83.2 kg (183 lb 6.8 oz) (03/30 0225)  Intake/Output from previous day: 03/29 0701 - 03/30 0700 In: 375 [I.V.:375] Out: -  Intake/Output this shift: Total I/O In: -  Out: 575 [Urine:575] Nutritional status: NPO  Past Medical History  Diagnosis Date  . Osteoarthritis   . Hypertension   . Hx of adenomatous colonic polyps   . Diverticulosis   . Prostate cancer   . Paget's disease   . Kidney stone   . Sinusitis   . SDAT (senile dementia of Alzheimer's type)      Neurologic Exam:  Mental Status: Patient does not respond to verbal stimuli. Moves both upper extremities against gravity with deep sternal rub (right greater than left). Does not follow commands. No verbalizations noted.   Cranial Nerves: II: Discs flat bilaterally; Visual fields grossly normal, pupils 84mm  equal, round, reactive to light and accommodation III,IV, VI: doll's intact, eyes disconjugate at rest V,VII: face symmetric, corneal reflex absent on the right VIII: hearing normal bilaterally IX,X: gag reflex present   Motor: Moves both upper extremities against gravity with deep sternal rub. Has spontaneous movement of the  right lower and left lower extremity --slight increased tone on the right UE  Sensory: Pinprick and light touch intact throughout, bilaterally Deep Tendon Reflexes:  Right: Upper  Extremity   Left: Upper extremity   biceps (C-5 to C-6) 2/4   biceps (C-5 to C-6) 2/4 tricep (C7) 2/4    triceps (C7) 2/4 Brachioradialis (C6) 2/4  Brachioradialis (C6) 2/4  Lower Extremity Lower Extremity   no KJ or AJ  Plantars: Right: downgoing   Left: up going    Lab Results: Basic Metabolic Panel:  Recent Labs Lab 04/29/13 2115 04/29/13 2132 04/29/13 2301  NA 138 136*  --   K 3.9 3.7  --   CL 98 102  --   CO2 16*  --   --   GLUCOSE 123* 123*  --   BUN 24* 22  --   CREATININE 1.44* 1.60*  --   CALCIUM 9.0  --   --   MG  --   --  1.8  PHOS  --   --  3.3    Liver Function Tests:  Recent Labs Lab 04/29/13 2115  AST 26  ALT 20  ALKPHOS 118*  BILITOT 0.3  PROT 6.3  ALBUMIN 2.3*   No results found for this basename: LIPASE, AMYLASE,  in the last 168 hours  Recent Labs Lab 04/29/13 2301  AMMONIA 18    CBC:  Recent Labs Lab 04/29/13 2115 04/29/13 2132  WBC 9.2  --   NEUTROABS 6.5  --   HGB 10.3* 10.9*  HCT 31.6* 32.0*  MCV 92.1  --   PLT 295  --     Cardiac Enzymes: No results found for this basename: CKTOTAL, CKMB, CKMBINDEX, TROPONINI,  in the last 168 hours  Lipid Panel: No results found for this basename: CHOL, TRIG, HDL, CHOLHDL, VLDL, LDLCALC,  in the last 168 hours  CBG:  Recent Labs Lab 04/30/13 0232 04/30/13 0409 04/30/13 0813  GLUCAP 95 91 74    Microbiology: Results for orders placed during the hospital encounter of 04/29/13  MRSA PCR SCREENING     Status: None   Collection Time    04/30/13  2:31 AM      Result Value Ref Range Status   MRSA by PCR NEGATIVE  NEGATIVE Final   Comment:            The GeneXpert MRSA Assay (FDA     approved for NASAL specimens     only), is one component of a     comprehensive MRSA colonization      surveillance program. It is not     intended to diagnose MRSA     infection nor to guide or     monitor treatment for     MRSA infections.    Coagulation Studies:  Recent Labs  04/29/13 2115  LABPROT 27.6*  INR 2.68*    Imaging: Ct Head (brain) Wo Contrast  04/29/2013   CLINICAL DATA:  Code stroke. Report of seizure with postictal state.  EXAM: CT HEAD WITHOUT CONTRAST  TECHNIQUE: Contiguous axial images were obtained from the base of the skull through the vertex without intravenous contrast.  COMPARISON:  01/14/2013  FINDINGS: Skull and Sinuses:No acute findings. Bilateral mastoidectomy with aerated mastoid poles. Chronic sinusitis, especially of the left sphenoid. Nasal trumpet, imaged portions unremarkable.  On the sagittal scout image, there is a type 2 dens fracture with posterior displacement. Alignment is grossly to previous imaging.  Orbits: No acute abnormality.  Brain: There is small volume high density abnormality in the left posterior frontal and parietal regions, presumably subarachnoid. Sulci in this region are effaced. There is no evidence of  large territory cytotoxic edema. Extensive chronic small vessel disease with ischemic gliosis throughout the bilateral cerebral white matter, with a frontal predilection. Ventriculomegaly which is ex vacuo in the setting of generalized brain atrophy.  Critical Value/emergent results were called by telephone at the time of interpretation on 04/29/2013 at 9:41 PM to Rapid Response RN, who verbally acknowledged these results and immediately relayed findings to Dr. Doy Mince.  IMPRESSION: 1. Small volume subarachnoid hemorrhage along the left cerebral convexity. Sulcal effacement in the posterior left cerebral hemisphere may reflect additional subarachnoid blood/debris or seizure related swelling. 2. Chronic type 2 dens fracture.   Electronically Signed   By: Jorje Guild M.D.   On: 04/29/2013 21:45   Dg Chest Port 1 View  04/30/2013   CLINICAL  DATA:  Hypoxia, abnormal breath sounds  EXAM: PORTABLE CHEST - 1 VIEW  COMPARISON:  02/25/2013  FINDINGS: Overall low lung volumes increased diffuse left lung patchy opacity concerning for developing pneumonia or aspiration. Minor right mid lung atelectasis along the minor fissure. No enlarging effusion or pneumothorax. Stable mild cardiomegaly with normal vascularity. Atherosclerosis of the aorta. Degenerative changes of the spine. Exam is rotated to the left.  IMPRESSION: Worsening patchy left lung airspace opacities concerning for atelectasis/ pneumonia. Aspiration not excluded.  Minor right mid lung atelectasis  Persistent low lung volumes   Electronically Signed   By: Daryll Brod M.D.   On: 04/30/2013 07:52       MEDICATIONS                                                                                                                        Scheduled: . levETIRAcetam  500 mg Intravenous Q12H  . piperacillin-tazobactam (ZOSYN)  IV  3.375 g Intravenous Q8H  . sodium chloride  3 mL Intravenous Q12H    ASSESSMENT/PLAN:                                                                                                            80 y.o. male presenting after his first seizure. Head CT shows small area of hyperdensity in the left parietal region that likely represents SAH. Pateint was on Xeralto and reversal protocol has been initiated. Unclear if SAH is a consequence of the seizure or was spontaneous secondary to anticoagualtion and is what caused the seizure. Family has decided to not undergo multiple studies do to end stage dementia. Mg, Phosph, Ammonia all normal Pending: EEG  Assessment and plan discussed with with attending physician and they are in agreement.  Etta Quill PA-C [facility] [phone]  04/30/2013, 10:37 AM

## 2013-05-01 ENCOUNTER — Inpatient Hospital Stay (HOSPITAL_COMMUNITY): Payer: 59

## 2013-05-01 LAB — GLUCOSE, CAPILLARY
GLUCOSE-CAPILLARY: 92 mg/dL (ref 70–99)
GLUCOSE-CAPILLARY: 98 mg/dL (ref 70–99)
Glucose-Capillary: 65 mg/dL — ABNORMAL LOW (ref 70–99)
Glucose-Capillary: 74 mg/dL (ref 70–99)
Glucose-Capillary: 91 mg/dL (ref 70–99)
Glucose-Capillary: 82 mg/dL (ref 70–99)
Glucose-Capillary: 77 mg/dL (ref 70–99)
Glucose-Capillary: 82 mg/dL (ref 70–99)

## 2013-05-01 LAB — BASIC METABOLIC PANEL
BUN: 20 mg/dL (ref 6–23)
CO2: 22 mEq/L (ref 19–32)
Calcium: 8.5 mg/dL (ref 8.4–10.5)
Chloride: 105 mEq/L (ref 96–112)
Creatinine, Ser: 1.19 mg/dL (ref 0.50–1.35)
GFR calc Af Amer: 65 mL/min — ABNORMAL LOW (ref >90)
GFR, EST NON AFRICAN AMERICAN: 56 mL/min — AB (ref >90)
Glucose, Bld: 95 mg/dL (ref 70–99)
POTASSIUM: 3.8 meq/L (ref 3.7–5.3)
SODIUM: 140 meq/L (ref 137–147)

## 2013-05-01 MED ORDER — SODIUM CHLORIDE 0.9 % IV SOLN
100.0000 mg | Freq: Two times a day (BID) | INTRAVENOUS | Status: DC
  Administered 2013-05-01 – 2013-05-02 (×2): 100 mg via INTRAVENOUS
  Filled 2013-05-01 (×6): qty 10

## 2013-05-01 MED ORDER — KCL IN DEXTROSE-NACL 20-5-0.45 MEQ/L-%-% IV SOLN
INTRAVENOUS | Status: DC
  Administered 2013-05-01: 01:00:00 via INTRAVENOUS
  Administered 2013-05-02: 20 mL/h via INTRAVENOUS
  Administered 2013-05-02: 06:00:00 via INTRAVENOUS
  Filled 2013-05-01 (×4): qty 1000

## 2013-05-01 MED ORDER — DEXTROSE 50 % IV SOLN
25.0000 mL | Freq: Once | INTRAVENOUS | Status: AC
  Administered 2013-05-01: 25 mL via INTRAVENOUS
  Filled 2013-05-01: qty 50

## 2013-05-01 NOTE — Progress Notes (Signed)
Dr. Joylene Draft was notified about pt's low CBGs at midnight  65 and 74. Orders were received and implemented. D501/2amp was given and IVF were changed. Pt's last CBG was 92. Continue to monitor the pt.

## 2013-05-01 NOTE — Progress Notes (Signed)
Subjective: I spoke at length with Karon's wife, Debra, after seeing him yesterday AM.  She understands that his condition is severe and she wishes for him to be comfortable overall.  He underwent EEG yesterday showing slowing, but no seizure activity (Neuro was to call her with result yesterday afternoon and their recs).  She does not want to consider feeding tube or any extraordinary measures.  This AM, he is barely arousable.  We had discussed Hospice care and she wants to hear more about it (likely facility/SNF level).  Had a tested CBG low yesterday (not on meds) and given D50, now on D5  Objective: Vital signs in last 24 hours: Temp:  [98.3 F (36.8 C)-99.1 F (37.3 C)] 98.3 F (36.8 C) (03/31 0700) Pulse Rate:  [37-69] 66 (03/31 0305) Resp:  [20-26] 23 (03/31 0021) BP: (99-148)/(51-71) 148/71 mmHg (03/31 0305) SpO2:  [100 %] 100 % (03/31 0021) FiO2 (%):  [50 %] 50 % (03/31 0000) Weight:  [84.7 kg (186 lb 11.7 oz)] 84.7 kg (186 lb 11.7 oz) (03/31 0305) Weight change: 0.3 kg (10.6 oz) Last BM Date:  (unknown- PTA)  Intake/Output from previous day: 03/30 0701 - 03/31 0700 In: 1450 [I.V.:1350; IV Piggyback:100] Out: 1605 [Urine:1605] Intake/Output this shift:    General appearance-well-nourished lying in bed with facemask in place  No evidence of head trauma  Sclera anicteric, face is symmetric  Neck supple, no cervical lymphadenopathy  Lungs reveal diffuse rhonchi but no respiratory distress on oxygen  Heart: iiregularly irregular rhythm c/w afibAbdomen soft, nontender, nondistended bowel sounds present  Trace edema bilateral LE's  Neurologic exam: Slow to respond, no lateralizing signs, severe dementia at baseline.  No improvement in his mental status. Skin: Multiple hip, heel and sacral decubiti, dressed appropriately.   Lab Results:  Recent Labs  04/29/13 2115 04/29/13 2132  WBC 9.2  --   HGB 10.3* 10.9*  HCT 31.6* 32.0*  PLT 295  --    BMET  Recent Labs  04/29/13 2115 04/29/13 2132 05/01/13 0316  NA 138 136* 140  K 3.9 3.7 3.8  CL 98 102 105  CO2 16*  --  22  GLUCOSE 123* 123* 95  BUN 24* 22 20  CREATININE 1.44* 1.60* 1.19  CALCIUM 9.0  --  8.5    Studies/Results: Ct Head (brain) Wo Contrast  04/29/2013   CLINICAL DATA:  Code stroke. Report of seizure with postictal state.  EXAM: CT HEAD WITHOUT CONTRAST  TECHNIQUE: Contiguous axial images were obtained from the base of the skull through the vertex without intravenous contrast.  COMPARISON:  01/14/2013  FINDINGS: Skull and Sinuses:No acute findings. Bilateral mastoidectomy with aerated mastoid poles. Chronic sinusitis, especially of the left sphenoid. Nasal trumpet, imaged portions unremarkable.  On the sagittal scout image, there is a type 2 dens fracture with posterior displacement. Alignment is grossly to previous imaging.  Orbits: No acute abnormality.  Brain: There is small volume high density abnormality in the left posterior frontal and parietal regions, presumably subarachnoid. Sulci in this region are effaced. There is no evidence of large territory cytotoxic edema. Extensive chronic small vessel disease with ischemic gliosis throughout the bilateral cerebral white matter, with a frontal predilection. Ventriculomegaly which is ex vacuo in the setting of generalized brain atrophy.  Critical Value/emergent results were called by telephone at the time of interpretation on 04/29/2013 at 9:41 PM to Rapid Response RN, who verbally acknowledged these results and immediately relayed findings to Dr. Reynolds.  IMPRESSION: 1. Small volume   subarachnoid hemorrhage along the left cerebral convexity. Sulcal effacement in the posterior left cerebral hemisphere may reflect additional subarachnoid blood/debris or seizure related swelling. 2. Chronic type 2 dens fracture.   Electronically Signed   By: Jorje Guild M.D.   On: 04/29/2013 21:45   Dg Chest Port 1 View  04/30/2013   CLINICAL DATA:  Hypoxia,  abnormal breath sounds  EXAM: PORTABLE CHEST - 1 VIEW  COMPARISON:  02/25/2013  FINDINGS: Overall low lung volumes increased diffuse left lung patchy opacity concerning for developing pneumonia or aspiration. Minor right mid lung atelectasis along the minor fissure. No enlarging effusion or pneumothorax. Stable mild cardiomegaly with normal vascularity. Atherosclerosis of the aorta. Degenerative changes of the spine. Exam is rotated to the left.  IMPRESSION: Worsening patchy left lung airspace opacities concerning for atelectasis/ pneumonia. Aspiration not excluded.  Minor right mid lung atelectasis  Persistent low lung volumes   Electronically Signed   By: Daryll Brod M.D.   On: 04/30/2013 07:52    Medications:  I have reviewed the patient's current medications. Scheduled: . collagenase   Topical Daily  . levETIRAcetam  500 mg Intravenous Q12H  . piperacillin-tazobactam (ZOSYN)  IV  3.375 g Intravenous Q8H  . sodium chloride  3 mL Intravenous Q12H   Continuous: . dextrose 5 % and 0.45 % NaCl with KCl 20 mEq/L 75 mL/hr at 05/01/13 0103   OMB:[id number], acetaminophen, metoprolol  Assessment/Plan: 1) Subarachnoid hemorrhage. He had been started on Xarelto the end of January by Cards during an admission for afib/RVR with a DVT being found. No evidence of trauma at this point, but his ambulation is poor as noted from his last office visit. Prior, he was wheelchair bound and has had decubiti as well. He has progressive dementia and is disoriented, which led to his placement at his current facility due to wife's continued work and [employer] needing 24 hour supervision. Given these, I think that an aggressive risk factor workup to prevent next events is unnecessary.EEG negative for seizure activity.  Neuro needs to give their final recs to his wife, including likely discontinuation of seizure meds and is ok to sign off his care. 2) Afib/RVR IV metoprolol for rate control   IN 60's this AM 3) Aspiration  PNA- Zosyn per pharmacy. Remains NPO and will have more workup for swallowing safety (which he may very well fail).  Will repeat CXR this AM for documentation, if appears better, will d/c abx as aspiration is usually a chemical injury, not bacterial. 4) Paget's Not on bisphosphonate due to being at facility, mild elevation of alk phos consistent with his long term disease.  5) Prostate CA- Not an issue at current, will watch voiding however. 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 His overall prognosis based even solely on his progression of issues related to general health deterioration while at facility is poor. He is now DNR/DNI [facility] Blades does not wish for him to have feeding tube under any circumstances, which I agree with.  We discussed Hospice, or SNF care as he is nearing end of life as he has not shown improvement in his mental status and has had plenty of time from the EMS given sedative/antiseizure meds to wear off.  Have ordered Hospice consult to talk with Hilda Blades today regarding end of life care.  From our conversation, she was not certain  as to where she wanted him to be at this point, but I would think SNF or Hospice facility would be his best options.   Will move to floor on tele (as no final decision made and has Afib with RVR) and will proceed with whatever his wife wishes at this point.  LOS: 2 days   TISOVEC,RICHARD W 05/01/2013, 7:45 AM   

## 2013-05-01 NOTE — Progress Notes (Signed)
SLP Cancellation Note  Patient Details Name: Casey Moore MRN: [id number] DOB: 28-Dec-1931   Cancelled swallow evaluation:       Reason Eval/Treat Not Completed: Patient's level of consciousness Will f/u next date for improvements.   Juan Quam Laurice 05/01/2013, 10:24 AM

## 2013-05-01 NOTE — Progress Notes (Signed)
NEURO HOSPITALIST PROGRESS NOTE   SUBJECTIVE:                                                                                                                        Patient only responds to noxious stimuli.    OBJECTIVE:                                                                                                                           Vital signs in last 24 hours: Temp:  [98.3 F (36.8 C)-99.1 F (37.3 C)] 98.3 F (36.8 C) (03/31 0700) Pulse Rate:  [37-69] 57 (03/31 0722) Resp:  [19-26] 19 (03/31 0722) BP: (99-148)/(51-71) 109/67 mmHg (03/31 0722) SpO2:  [100 %] 100 % (03/31 0722) FiO2 (%):  [50 %] 50 % (03/31 0000) Weight:  [84.7 kg (186 lb 11.7 oz)] 84.7 kg (186 lb 11.7 oz) (03/31 0305)  Intake/Output from previous day: 03/30 0701 - 03/31 0700 In: 1450 [I.V.:1350; IV Piggyback:100] Out: 1607 [Urine:1605] Intake/Output this shift: Total I/O In: 125 [I.V.:75; IV Piggyback:50] Out: 125 [Urine:125] Nutritional status: NPO  Past Medical History  Diagnosis Date  . Osteoarthritis   . Hypertension   . Hx of adenomatous colonic polyps   . Diverticulosis   . Prostate cancer   . Paget's disease   . Kidney stone   . Sinusitis   . SDAT (senile dementia of Alzheimer's type)      Neurologic Exam:   Mental Status:  Patient does not respond to verbal stimuli. Moves both upper extremities against gravity with deep sternal rub . Does not follow commands. No verbalizations noted.  Cranial Nerves:  II: Discs flat bilaterally; Visual fields grossly normal, pupils 31mm equal, round, reactive to light and accommodation  III,IV, VI: doll's intact, eyes disconjugate at rest  V,VII: face symmetric, corneal reflex absent on the right  VIII: hearing normal bilaterally  IX,X: gag reflex present  Motor:  Moves both upper extremities against gravity with deep sternal rub. Has spontaneous movement of the right lower and left lower extremity   --slight increased tone on the right UE  Sensory: Pinprick and light touch intact throughout, bilaterally  Deep Tendon Reflexes:  2+ bilateral UE Lower Extremity Lower Extremity  no KJ  or AJ  Plantars:  Right: downgoing  Left: up going  Lab Results: Basic Metabolic Panel:  Recent Labs Lab 04/29/13 2115 04/29/13 2132 04/29/13 2301 05/01/13 0316  NA 138 136*  --  140  K 3.9 3.7  --  3.8  CL 98 102  --  105  CO2 16*  --   --  22  GLUCOSE 123* 123*  --  95  BUN 24* 22  --  20  CREATININE 1.44* 1.60*  --  1.19  CALCIUM 9.0  --   --  8.5  MG  --   --  1.8  --   PHOS  --   --  3.3  --     Liver Function Tests:  Recent Labs Lab 04/29/13 2115  AST 26  ALT 20  ALKPHOS 118*  BILITOT 0.3  PROT 6.3  ALBUMIN 2.3*   No results found for this basename: LIPASE, AMYLASE,  in the last 168 hours  Recent Labs Lab 04/29/13 2301  AMMONIA 18    CBC:  Recent Labs Lab 04/29/13 2115 04/29/13 2132  WBC 9.2  --   NEUTROABS 6.5  --   HGB 10.3* 10.9*  HCT 31.6* 32.0*  MCV 92.1  --   PLT 295  --     Cardiac Enzymes: No results found for this basename: CKTOTAL, CKMB, CKMBINDEX, TROPONINI,  in the last 168 hours  Lipid Panel: No results found for this basename: CHOL, TRIG, HDL, CHOLHDL, VLDL, LDLCALC,  in the last 168 hours  CBG:  Recent Labs Lab 05/01/13 0019 05/01/13 0025 05/01/13 0121 05/01/13 0313 05/01/13 0851  GLUCAP 65* 74 92 98 91    Microbiology: Results for orders placed during the hospital encounter of 04/29/13  MRSA PCR SCREENING     Status: None   Collection Time    04/30/13  2:31 AM      Result Value Ref Range Status   MRSA by PCR NEGATIVE  NEGATIVE Final   Comment:            The GeneXpert MRSA Assay (FDA     approved for NASAL specimens     only), is one component of a     comprehensive MRSA colonization     surveillance program. It is not     intended to diagnose MRSA     infection nor to guide or     monitor treatment for     MRSA  infections.    Coagulation Studies:  Recent Labs  04/29/13 2115  LABPROT 27.6*  INR 2.68*    Imaging: Ct Head (brain) Wo Contrast  04/29/2013   CLINICAL DATA:  Code stroke. Report of seizure with postictal state.  EXAM: CT HEAD WITHOUT CONTRAST  TECHNIQUE: Contiguous axial images were obtained from the base of the skull through the vertex without intravenous contrast.  COMPARISON:  01/14/2013  FINDINGS: Skull and Sinuses:No acute findings. Bilateral mastoidectomy with aerated mastoid poles. Chronic sinusitis, especially of the left sphenoid. Nasal trumpet, imaged portions unremarkable.  On the sagittal scout image, there is a type 2 dens fracture with posterior displacement. Alignment is grossly to previous imaging.  Orbits: No acute abnormality.  Brain: There is small volume high density abnormality in the left posterior frontal and parietal regions, presumably subarachnoid. Sulci in this region are effaced. There is no evidence of large territory cytotoxic edema. Extensive chronic small vessel disease with ischemic gliosis throughout the bilateral cerebral white matter, with a frontal predilection. Ventriculomegaly which is  ex vacuo in the setting of generalized brain atrophy.  Critical Value/emergent results were called by telephone at the time of interpretation on 04/29/2013 at 9:41 PM to Rapid Response RN, who verbally acknowledged these results and immediately relayed findings to Dr. Doy Mince.  IMPRESSION: 1. Small volume subarachnoid hemorrhage along the left cerebral convexity. Sulcal effacement in the posterior left cerebral hemisphere may reflect additional subarachnoid blood/debris or seizure related swelling. 2. Chronic type 2 dens fracture.   Electronically Signed   By: Jorje Guild M.D.   On: 04/29/2013 21:45   Dg Chest Port 1 View  05/01/2013   CLINICAL DATA:  Followup aspiration  EXAM: PORTABLE CHEST - 1 VIEW  COMPARISON:  04/30/2013  FINDINGS: Cardiomediastinal silhouette is  stable. Slight improvement in aeration. Persistent residual infiltrate left perihilar and lingula. Right lung is clear. Mild elevation of the right hemidiaphragm. No pulmonary edema. Stable degenerative changes thoracic spine.  IMPRESSION: Slight improvement in aeration. Persistent residual infiltrate left perihilar and lingula. Right lung is clear. Mild elevation of the right hemidiaphragm. No pulmonary edema. Stable degenerative changes thoracic spine.   Electronically Signed   By: Lahoma Crocker M.D.   On: 05/01/2013 08:04   Dg Chest Port 1 View  04/30/2013   CLINICAL DATA:  Hypoxia, abnormal breath sounds  EXAM: PORTABLE CHEST - 1 VIEW  COMPARISON:  02/25/2013  FINDINGS: Overall low lung volumes increased diffuse left lung patchy opacity concerning for developing pneumonia or aspiration. Minor right mid lung atelectasis along the minor fissure. No enlarging effusion or pneumothorax. Stable mild cardiomegaly with normal vascularity. Atherosclerosis of the aorta. Degenerative changes of the spine. Exam is rotated to the left.  IMPRESSION: Worsening patchy left lung airspace opacities concerning for atelectasis/ pneumonia. Aspiration not excluded.  Minor right mid lung atelectasis  Persistent low lung volumes   Electronically Signed   By: Daryll Brod M.D.   On: 04/30/2013 07:52       MEDICATIONS                                                                                                                        Scheduled: . collagenase   Topical Daily  . levETIRAcetam  500 mg Intravenous Q12H  . piperacillin-tazobactam (ZOSYN)  IV  3.375 g Intravenous Q8H  . sodium chloride  3 mL Intravenous Q12H   EEG: Impression: this is an abnormal EEG with findings consistent with a moderate encephalopathy, non specific as to cause. No electrographic seizures noted.  ASSESSMENT/PLAN:  80 y.o. male  presenting after his first seizure. Head CT shows small area of hyperdensity in the left parietal region that likely represents SAH. Pateint was on Xeralto and reversal protocol has been initiated. Unclear if SAH is a consequence of the seizure or was spontaneous secondary to anticoagualtion and is what caused the seizure. No further seizure noted while in hospital. EEG shows no epileptiform activity.  Patient is on Keppra 500 mg BID.  There is possibility this may be making him drowsy--will change to Vimpat.  Would recommend continuing Vimpat 100 mg BID due to witnessed seizure on transport. Wife to talk to Hospice later today.  Neurology will S/O.     Assessment and plan discussed with with attending physician and they are in agreement.    Etta Quill PA-C [facility] [phone]  05/01/2013, 9:55 AM

## 2013-05-01 NOTE — Progress Notes (Addendum)
Thank you for consulting the Palliative Medicine Team at [facility] to meet your patient's and family's needs.   The reason that you asked [place] to see your patient is to assist with goals of care and provide Hospice information.  We have scheduled your patient for a meeting: 4/1 at [place] is: Patient's wife Casey Moore. Contact information:On Chart  Your patient is able/unable to participate: NO  If there any urgent symptom management needs or palliative issues please call [phone].

## 2013-05-02 DIAGNOSIS — Z515 Encounter for palliative care: Secondary | ICD-10-CM

## 2013-05-02 DIAGNOSIS — R1310 Dysphagia, unspecified: Secondary | ICD-10-CM

## 2013-05-02 DIAGNOSIS — R0600 Dyspnea, unspecified: Secondary | ICD-10-CM

## 2013-05-02 LAB — BASIC METABOLIC PANEL
BUN: 15 mg/dL (ref 6–23)
CO2: 23 meq/L (ref 19–32)
Calcium: 8.6 mg/dL (ref 8.4–10.5)
Chloride: 107 mEq/L (ref 96–112)
Creatinine, Ser: 1.12 mg/dL (ref 0.50–1.35)
GFR calc Af Amer: 70 mL/min — ABNORMAL LOW (ref >90)
GFR, EST NON AFRICAN AMERICAN: 61 mL/min — AB (ref >90)
Glucose, Bld: 104 mg/dL — ABNORMAL HIGH (ref 70–99)
Potassium: 4.3 mEq/L (ref 3.7–5.3)
Sodium: 142 mEq/L (ref 137–147)

## 2013-05-02 LAB — GLUCOSE, CAPILLARY
GLUCOSE-CAPILLARY: 86 mg/dL (ref 70–99)
GLUCOSE-CAPILLARY: 87 mg/dL (ref 70–99)

## 2013-05-02 MED ORDER — LORAZEPAM 2 MG/ML IJ SOLN: 1.0000 mg | INTRAMUSCULAR | Status: DC | PRN

## 2013-05-02 MED ORDER — SODIUM CHLORIDE 0.9 % IV SOLN
500.0000 mg | Freq: Two times a day (BID) | INTRAVENOUS | Status: DC
  Administered 2013-05-02 – 2013-05-03 (×3): 500 mg via INTRAVENOUS
  Filled 2013-05-02 (×4): qty 5

## 2013-05-02 MED ORDER — MORPHINE SULFATE 2 MG/ML IJ SOLN: 2.0000 mg | INTRAMUSCULAR | Status: DC | PRN

## 2013-05-02 MED ORDER — BISACODYL 10 MG RE SUPP: 10.0000 mg | Freq: Every day | RECTAL | Status: DC | PRN

## 2013-05-02 NOTE — Progress Notes (Signed)
Subjective: A bit more awake this AM, but still will not follow commands, nonverbal.  Objective: Vital signs in last 24 hours: Temp:  [97.9 F (36.6 C)-98.5 F (36.9 C)] 98.5 F (36.9 C) (04/01 0449) Pulse Rate:  [58-69] 66 (04/01 0449) Resp:  [15-20] 18 (04/01 0449) BP: (110-128)/(59-78) 110/59 mmHg (04/01 0449) SpO2:  [96 %-100 %] 100 % (04/01 0449) Weight:  [78.3 kg (172 lb 9.9 oz)] 78.3 kg (172 lb 9.9 oz) (04/01 0500) Weight change: -6.4 kg (-14 lb 1.8 oz) Last BM Date: 05/01/13  Intake/Output from previous day: 03/31 0701 - 04/01 0700 In: 951.3 [I.V.:901.3; IV Piggyback:50] Out: 500 [Urine:500] Intake/Output this shift:   General appearance-well-nourished lying in bed with facemask in place  No evidence of head trauma  Sclera anicteric, face is symmetric  Neck supple, no cervical lymphadenopathy  Lungs reveal diffuse rhonchi but no respiratory distress on oxygen  Heart: iiregularly irregular rhythm c/w afibAbdomen soft, nontender, nondistended bowel sounds present  Trace edema bilateral LE's  Neurologic exam: Slow to respond, no lateralizing signs, severe dementia at baseline. No improvement in his mental status.  Skin: Multiple hip, heel and sacral decubiti, dressed appropriately.      Lab Results:  Recent Labs  04/29/13 2115 04/29/13 2132  WBC 9.2  --   HGB 10.3* 10.9*  HCT 31.6* 32.0*  PLT 295  --    BMET  Recent Labs  05/01/13 0316 05/02/13 0539  NA 140 142  K 3.8 4.3  CL 105 107  CO2 22 23  GLUCOSE 95 104*  BUN 20 15  CREATININE 1.19 1.12  CALCIUM 8.5 8.6    Studies/Results: Dg Chest Port 1 View  05/01/2013   CLINICAL DATA:  Followup aspiration  EXAM: PORTABLE CHEST - 1 VIEW  COMPARISON:  04/30/2013  FINDINGS: Cardiomediastinal silhouette is stable. Slight improvement in aeration. Persistent residual infiltrate left perihilar and lingula. Right lung is clear. Mild elevation of the right hemidiaphragm. No pulmonary edema. Stable degenerative  changes thoracic spine.  IMPRESSION: Slight improvement in aeration. Persistent residual infiltrate left perihilar and lingula. Right lung is clear. Mild elevation of the right hemidiaphragm. No pulmonary edema. Stable degenerative changes thoracic spine.   Electronically Signed   By: Lahoma Crocker M.D.   On: 05/01/2013 08:04    Medications:  I have reviewed the patient's current medications. Scheduled: . collagenase   Topical Daily  . lacosamide (VIMPAT) IV  100 mg Intravenous Q12H  . piperacillin-tazobactam (ZOSYN)  IV  3.375 g Intravenous Q8H  . sodium chloride  3 mL Intravenous Q12H   Continuous: . dextrose 5 % and 0.45 % NaCl with KCl 20 mEq/L 75 mL/hr at 05/02/13 0625   RUE:[id number], acetaminophen, metoprolol  Assessment/Plan: 1) Subarachnoid hemorrhage. He had been started on Xarelto the end of January by Cards during an admission for afib/RVR with a DVT being found. No evidence of trauma at this point, but his ambulation is poor as noted from his last office visit. Prior, he was wheelchair bound and has had decubiti as well. He has progressive dementia and is disoriented, which led to his placement at his current facility due to wife's continued work and [employer] needing 24 hour supervision. Given these, I think that an aggressive risk factor workup to prevent next events is unnecessary.EEG negative for seizure activity. Neuro indicated a preference for Vimpat and has signed off. 2) Afib/RVR IV metoprolol for rate control In 60's this AM Still not able to take by mouth 3) Aspiration PNA-  Zosyn per pharmacy. Remains NPO and will have more workup for swallowing safety (which he may very well fail). CXR post aspiration still shwos one base with PNA.  Standard treatment is 7-10 days. 4) Paget's Not on bisphosphonate due to being at facility, mild elevation of alk phos consistent with his long term disease.  5) Prostate CA- Not an issue at current, will watch voiding however.  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 His overall prognosis based even solely on his progression of issues related to general health deterioration while at facility is poor. He is now DNR/DNI Hilda Blades does not wish for him to have feeding tube under any circumstances, which I agree with. We discussed Hospice, or SNF care as he is nearing end of life as he has not shown improvement in his mental status.  He is a bit more alert, but not awake enough to follow commands and nutrition/meds will remain an issue.  There is a palliative care meeting that his wife is to attend this AM.  She is not present at my rounds again this AM.   Plan: Depends on outcome of palliative meeting.  She was more hopeful that he could go to [facility].  I noted that availability might be limiting and she is willing to consider SNF with Hospice care as well.  He clearly cannot return to his prior level of care, and his multiple decubiti/wounds also make this a bad idea as well.  Hopefully she will provide some direction post meeting.   LOS: 3 days   Loyd Marhefka W 05/02/2013, 8:10 AM

## 2013-05-02 NOTE — Clinical Social Work Psychosocial (Signed)
Clinical Social Work Department BRIEF PSYCHOSOCIAL ASSESSMENT 05/02/2013  Patient:  Casey Moore, Casey Moore     Account Number:  [id number]     Admit date:  04/29/2013  Clinical Social Worker:  Hubert Azure  Date/Time:  05/02/2013 03:09 PM  Referred by:  Physician  Date Referred:  05/02/2013 Referred for  SNF Placement   Other Referral:   Interview type:  Family Other interview type:   CSW spoke with patient's wife as patient was unable to communicate with CSW.    PSYCHOSOCIAL DATA Living Status:  FACILITY Admitted from facility:  [facility] Level of care:  Assisted Living Primary support name:  Casey Moore [phone]) Primary support relationship to patient:  SPOUSE Degree of support available:   Good    CURRENT CONCERNS Current Concerns  Post-Acute Placement   Other Concerns:   Palliative Care Consulted    SOCIAL WORK ASSESSMENT / PLAN CSW spoke with patient's wife Casey Moore) via telephone. CSW introduced self and explained role. CSW discussed d/c plan with wife. Per Casey Moore, patient has resided at [facility] for several years. Patient's wife stated she desires [facility]. Patient's wife is agreeable to other hospice agencies, but prefers [facility].  Patient's wife stated she is thankful for the time she and her husband had together, and just wishes comfort and dignity for him.   Assessment/plan status:  Referral to [facility] Other assessment/ plan:   Information/referral to community resources:   [facility], other hospice facilities, SNF.    PATIENT'S/FAMILY'S RESPONSE TO PLAN OF CARE: Patient's wife thanked CSW for assistance during this time.   [place], [place] Weekend Clinical Social Worker [phone]

## 2013-05-02 NOTE — Consult Note (Signed)
Patient ZO:XWRU KENNEN Moore      DOB: April 24, 1931      EAV:[id number]     Consult Note from the Palliative Medicine Team at [facility] [facility] Requested by: Dr Odette Fraction     PCP: Haywood Pao, MD Reason for Consultation: Clarification of [place] and options     Phone Number:[phone]  Assessment of patients Current state: 80 year old gentleman who is married, but resides at some sort of assisted living/independent living facility with the aid of home health, who has a past medical history significant for atrial fibrillation with rapid ventricular response, on anticoagulation was a relative, severe dementia, essentially being wheelchair ridden the last several months, complicated further by hyperlipidemia, gout, osteoarthritis chronic kidney disease stage III Paget's disease hypertension and a known history of right bundle branch block and decubital ulcers on the lower extremities.   Reported continued failure to thrive, poor po intake (albumin 2.3), physical, functional and cognitive decline.  Now with seizure, SAH, A-fib PNA and a full comfort approach the prognosis is likely days to weeks.   This NP Wadie Lessen reviewed medical records, received report from team, assessed the patient and then meet at the patient's bedside along with his wife  Casey Moore # [phone], and her friend Lourena Simmonds  to discuss diagnosis prognosis, [place], EOL wishes disposition and options.  A detailed discussion was had today regarding advanced directives.  Concepts specific to code status, artifical feeding and hydration, continued IV antibiotics and rehospitalization was had.  The difference between a aggressive medical intervention path  and a palliative comfort care path for this patient at this time was had.  Values and goals of care important to patient and family were attempted to be elicited.  Concept of Hospice and Palliative Care were discussed  Natural trajectory and expectations at EOL  were discussed.  Questions and concerns addressed.  Hard Choices booklet left for review. Family encouraged to call with questions or concerns.  PMT will continue to support holistically.   Goals of Care: 1.  Code Status:DNR/DNI-comfort is main focus of care  2. Scope of Treatment: 1. Vital Signs: daily  2. Respiratory/Oxygen:for comfort only 3. Nutritional Support/Tube Feeds:no artificial feeding now or in the future 4. Antibiotics:none 5. Blood Products:none 6. ZHY:QMVH 7. Review of Medications to be discontinued:minimze for comfort 8. Labs:none 9. Telemetry:none    3. Disposition:   Hopeful for residential hospice, will write for choice.  She does not want a SNF for her husband at EOL   4. Symptom Management:   1. Anxiety/Agitation: Ativan 1 mg IV every 4 hrs prn 2. Pain/Dyspnea: Morphine 2 mg IV every 2 hrs prn 3. Seizure: Keppra 500 mg IV every 12 hrs 4. Dysphagia: sips and chips as tolerated with known risk of aspiration  5. Psychosocial:  Emotional support to his wife of 62 yrs.  She expresses gratitude for their life together and at this times hopes for comfort and dignity.       Patient Documents Completed or Given: Document Given Completed  Advanced Directives Pkt    MOST  yes  DNR yes   Gone from My Sight    Hard Choices yes     Brief HPI:80 year old gentleman who is married, but resides at some sort of assisted living/independent living facility with the aid of home health, who has a past medical history significant for atrial fibrillation with rapid ventricular response, on anticoagulation was a relative, severe dementia, essentially being wheelchair ridden the last several  months, complicated further by hyperlipidemia, gout, osteoarthritis chronic kidney disease stage III Paget's disease hypertension and a known history of right bundle branch block and decubital ulcers on the lower extremities. He was in this facility today noted to be flailing, and  unresponsive, EMS called, thought to have seizure activity given Versed after suffering a tonic-clonic seizure in route to the emergency room. Unresponsive in the emergency room, code stroke called, evaluated by Dr. Doy Mince with the idea that he is presenting for his first seizure now sedated with Versed and no clear examination consistent with an acute and 4 however head CT showing an area likely representing a subarachnoid hemorrhage on anticoagulation. Unclear whether subarachnoid hemorrhage occurred as a consequence of the seizure or secondary to anticoagulation and that causing the seizure. Recommending further workup, this was discussed with the wife who is now absent from the emergency room upon my arrival. Brief review of the last office visit note by patient's primary care provider reveals that the patient's baseline quality of life is quite poor with significant dementia, transported in a wheelchair, with progressive decline. Patient apparently is DO NOT RESUSCITATE DO NOT INTUBATE her emergency room physician after consultation with wife prior to her leaving the emergency room.       WUJ:WJXBJY to illicit due to decreased cognition   PMH:  Past Medical History  Diagnosis Date  . Osteoarthritis   . Hypertension   . Hx of adenomatous colonic polyps   . Diverticulosis   . Prostate cancer   . Paget's disease   . Kidney stone   . Sinusitis   . SDAT (senile dementia of Alzheimer's type)      PSH: Past Surgical History  Procedure Laterality Date  . Cataract extraction      left-with implant  . Total knee arthroplasty      bilateral  . Mastoid surgery      x 5  . Tonsillectomy and adenoidectomy    . Appendectomy    . Knee surgery      bilateral  . Prostatectomy    . Skin cancer excision      right hip   I have reviewed the FH and SH and  If appropriate update it with new information. No Known Allergies Scheduled Meds: . collagenase   Topical Daily  . lacosamide  (VIMPAT) IV  100 mg Intravenous Q12H  . piperacillin-tazobactam (ZOSYN)  IV  3.375 g Intravenous Q8H  . sodium chloride  3 mL Intravenous Q12H   Continuous Infusions: . dextrose 5 % and 0.45 % NaCl with KCl 20 mEq/L 75 mL/hr at 05/02/13 0625   PRN Meds:.acetaminophen, acetaminophen, metoprolol    BP 110/59  Pulse 66  Temp(Src) 98.5 F (36.9 C) (Oral)  Resp 18  Ht 6' (1.829 m)  Wt 78.3 kg (172 lb 9.9 oz)  BMI 23.41 kg/m2  SpO2 100%   PPS: 20 %   Intake/Output Summary (Last 24 hours) at 05/02/13 1007 Last data filed at 05/01/13 2000  Gross per 24 hour  Intake 826.25 ml  Output    375 ml  Net 451.25 ml    Physical Exam:  General: ill appearing, eyes open unable to follow commands, NAD HEENT:  Mm, audible throat secretions Chest:   Decreased in bases, scattered coarse BS CVS: RRR Abdomen:soft NT +BS Ext: without edema Neuro: unable to follow commands, awake  Labs: CBC    Component Value Date/Time   WBC 9.2 04/29/2013 2115   RBC 3.43* 04/29/2013 2115  HGB 10.9* 04/29/2013 2132   HCT 32.0* 04/29/2013 2132   PLT 295 04/29/2013 2115   MCV 92.1 04/29/2013 2115   MCH 30.0 04/29/2013 2115   MCHC 32.6 04/29/2013 2115   RDW 15.2 04/29/2013 2115   LYMPHSABS 1.8 04/29/2013 2115   MONOABS 0.8 04/29/2013 2115   EOSABS 0.1 04/29/2013 2115   BASOSABS 0.0 04/29/2013 2115    BMET    Component Value Date/Time   NA 142 05/02/2013 0539   K 4.3 05/02/2013 0539   CL 107 05/02/2013 0539   CO2 23 05/02/2013 0539   GLUCOSE 104* 05/02/2013 0539   BUN 15 05/02/2013 0539   CREATININE 1.12 05/02/2013 0539   CALCIUM 8.6 05/02/2013 0539   GFRNONAA 61* 05/02/2013 0539   GFRAA 70* 05/02/2013 0539    CMP     Component Value Date/Time   NA 142 05/02/2013 0539   K 4.3 05/02/2013 0539   CL 107 05/02/2013 0539   CO2 23 05/02/2013 0539   GLUCOSE 104* 05/02/2013 0539   BUN 15 05/02/2013 0539   CREATININE 1.12 05/02/2013 0539   CALCIUM 8.6 05/02/2013 0539   PROT 6.3 04/29/2013 2115   ALBUMIN 2.3* 04/29/2013 2115   AST 26  04/29/2013 2115   ALT 20 04/29/2013 2115   ALKPHOS 118* 04/29/2013 2115   BILITOT 0.3 04/29/2013 2115   GFRNONAA 61* 05/02/2013 0539   GFRAA 70* 05/02/2013 0539     Time In Time Out Total Time Spent with Patient Total Overall Time  0845 1005 75 min 80 min    Greater than 50%  of this time was spent counseling and coordinating care related to the above assessment and plan.   Wadie Lessen NP  Palliative Medicine Team Team Phone # [phone] Pager [phone]  Discussed with Dr Odette Fraction, alerted SW

## 2013-05-03 MED ORDER — BISACODYL 10 MG RE SUPP: 10.0000 mg | Freq: Every day | RECTAL | Status: AC | PRN

## 2013-05-03 MED ORDER — SODIUM CHLORIDE 0.9 % IV SOLN: 500.0000 mg | Freq: Two times a day (BID) | INTRAVENOUS | Status: AC

## 2013-05-03 MED ORDER — COLLAGENASE 250 UNIT/GM EX OINT: TOPICAL_OINTMENT | Freq: Every day | CUTANEOUS | Status: AC

## 2013-05-03 NOTE — Discharge Summary (Signed)
DISCHARGE SUMMARY  Casey Moore  MR#: [id number]  DOB:1931/10/24  Date of Admission: 04/29/2013 Date of Discharge: 05/03/2013  Attending Physician:Casey Moore  Patient's PPI:RJJOACZ,YSAYTKZ W, MD  Consults:Treatment Team:  Palliative [facility] consult service. Wound care consult service  Discharge Diagnoses: Active Problems:   Seizure   SAH (subarachnoid hemorrhage)   Subarachnoid hemorrhage   Palliative care encounter   Dyspnea   Dysphagia, unspecified(787.20)  Alzheimer's type dementia, end stage Failure to thrive Paget's disease OA hips and knees Gout Hyperlipidemia Afib + RVR, 02/2013  Discharge Medications:   Medication List    STOP taking these medications       allopurinol 300 MG tablet  Commonly known as:  ZYLOPRIM     atorvastatin 20 MG tablet  Commonly known as:  LIPITOR     DIAPER RASH 40 % ointment  Generic drug:  liver oil-zinc oxide     donepezil 10 MG tablet  Commonly known as:  ARICEPT     LORazepam 0.5 MG tablet  Commonly known as:  ATIVAN     metoprolol tartrate 25 MG tablet  Commonly known as:  LOPRESSOR     multivitamin tablet     Rivaroxaban 15 MG Tabs tablet  Commonly known as:  XARELTO     vitamin C 500 MG tablet  Commonly known as:  ASCORBIC ACID      TAKE these medications       bisacodyl 10 MG suppository  Commonly known as:  DULCOLAX  Place 1 suppository (10 mg total) rectally daily as needed for moderate constipation.     collagenase ointment  Commonly known as:  SANTYL  Apply topically daily.     levETIRAcetam 500 mg in sodium chloride 0.9 % 100 mL  Inject 500 mg into the vein every 12 (twelve) hours.        Hospital Procedures: Ct Head (brain) Wo Contrast  04/29/2013   CLINICAL DATA:  Code stroke. Report of seizure with postictal state.  EXAM: CT HEAD WITHOUT CONTRAST  TECHNIQUE: Contiguous axial images were obtained from the base of the skull through the vertex without intravenous  contrast.  COMPARISON:  01/14/2013  FINDINGS: Skull and Sinuses:No acute findings. Bilateral mastoidectomy with aerated mastoid poles. Chronic sinusitis, especially of the left sphenoid. Nasal trumpet, imaged portions unremarkable.  On the sagittal scout image, there is a type 2 dens fracture with posterior displacement. Alignment is grossly to previous imaging.  Orbits: No acute abnormality.  Brain: There is small volume high density abnormality in the left posterior frontal and parietal regions, presumably subarachnoid. Sulci in this region are effaced. There is no evidence of large territory cytotoxic edema. Extensive chronic small vessel disease with ischemic gliosis throughout the bilateral cerebral white matter, with a frontal predilection. Ventriculomegaly which is ex vacuo in the setting of generalized brain atrophy.  Critical Value/emergent results were called by telephone at the time of interpretation on 04/29/2013 at 9:41 PM to Rapid Response RN, who verbally acknowledged these results and immediately relayed findings to Dr. Doy Moore.  IMPRESSION: 1. Small volume subarachnoid hemorrhage along the left cerebral convexity. Sulcal effacement in the posterior left cerebral hemisphere may reflect additional subarachnoid blood/debris or seizure related swelling. 2. Chronic type 2 dens fracture.   Electronically Signed   By: Casey Moore M.D.   On: 04/29/2013 21:45   Dg Chest Port 1 View  05/01/2013   CLINICAL DATA:  Followup aspiration  EXAM: PORTABLE CHEST - 1 VIEW  COMPARISON:  04/30/2013  FINDINGS:  Cardiomediastinal silhouette is stable. Slight improvement in aeration. Persistent residual infiltrate left perihilar and lingula. Right lung is clear. Mild elevation of the right hemidiaphragm. No pulmonary edema. Stable degenerative changes thoracic spine.  IMPRESSION: Slight improvement in aeration. Persistent residual infiltrate left perihilar and lingula. Right lung is clear. Mild elevation of the right  hemidiaphragm. No pulmonary edema. Stable degenerative changes thoracic spine.   Electronically Signed   By: Casey Moore M.D.   On: 05/01/2013 08:04   Dg Chest Port 1 View  04/30/2013   CLINICAL DATA:  Hypoxia, abnormal breath sounds  EXAM: PORTABLE CHEST - 1 VIEW  COMPARISON:  02/25/2013  FINDINGS: Overall low lung volumes increased diffuse left lung patchy opacity concerning for developing pneumonia or aspiration. Minor right mid lung atelectasis along the minor fissure. No enlarging effusion or pneumothorax. Stable mild cardiomegaly with normal vascularity. Atherosclerosis of the aorta. Degenerative changes of the spine. Exam is rotated to the left.  IMPRESSION: Worsening patchy left lung airspace opacities concerning for atelectasis/ pneumonia. Aspiration not excluded.  Minor right mid lung atelectasis  Persistent low lung volumes   Electronically Signed   By: Casey Moore M.D.   On: 04/30/2013 07:52    History of Present Illness: Casey Moore is a 80 year old male with progressive Alzheimer's type dementia and failure to thrive with multiple decubiti who was last hospitalized for afib/RVR in January and started on anticoagulation.  He was sent to the ER this visit from his [facility] care center by EMS due to AMS and found to have a new subarachnoid hemorrhage on CT.  Hospital Course: Casey Moore was admitted and stabilized.  Anticoagulation was d/ced die to subarachnoid hemorrhage.  Neuro consult undertaken with EEG showing slowing, but no seizure activity.  Since he had witnessed seizure activity per EMS, was put on antiseizure meds.  Continued to not come around and was able to open eyes but remained nonverbal and nonparticipatory.  Due to his inability to take nutrition, long discussion involving myself and his wife, Casey Moore, took place over the first 3 days of his stay, noting that she did not want him to have a feeding tube and did not want him to suffer.  He had a palliative care  consult and decision was made to make him comfort care only, with the goal being that he would live out the remainder of his days at residential Hospice.  Discharged today as below for continued care.  Wound care had also been consulted due to his multiple sacral, heel and hip decubiti and provided dressing instructions.  Day of Discharge Exam BP 139/85  Pulse 124  Temp(Src) 97.7 F (36.5 C) (Oral)  Resp 18  Ht 6' (1.829 m)  Wt 78.3 kg (172 lb 9.9 oz)  BMI 23.41 kg/m2  SpO2 99%  Physical Exam: General appearance-well-nourished lying in bed with facemask in place  No evidence of head trauma  Sclera anicteric, face is symmetric  Neck supple, no cervical lymphadenopathy  Lungs reveal diffuse rhonchi but no respiratory distress on oxygen  Heart: iiregularly irregular rhythm c/Moore afibAbdomen soft, nontender, nondistended bowel sounds present  Trace edema bilateral LE's  Neurologic exam: Slow to respond, no lateralizing signs, severe dementia at baseline. No improvement in his mental status.  Skin: Multiple hip, heel and sacral decubiti, dressed appropriately   Discharge Labs:  Recent Labs  05/01/13 0316 05/02/13 0539  NA 140 142  K 3.8 4.3  CL 105 107  CO2 22 23  GLUCOSE 95  104*  BUN 20 15  CREATININE 1.19 1.12  CALCIUM 8.5 8.6    Disposition: [facility], who will add comfort meds and measures as they see fit. Condition on Discharge:Stable, but failing Signed: Kadien Lineman Moore 05/03/2013, 2:30 PM

## 2013-05-03 NOTE — Progress Notes (Signed)
  CSW spoke with Kennieth Francois from [facility] and the Pt is ok to transfer to facility today.   CSW notified MD of transfer and will send d/c summary with Pt at d/c.   CSW will continue to follow Pt for d/c planning.    [facility]  [address]N 1[address]16;  [phone] Phone: [phone]

## 2013-05-03 NOTE — Progress Notes (Signed)
Subjective: Meeting yesterday was productive, wife Casey Moore is in agreement with Hospice services.  Casey Moore is still nonverbal and puffing out his cheeks this AM but has a bit of a droop on the R side.  Casey Moore called our office and asked if there was anything I could do personally to move him up the list on [facility].  AS I had discussed with her even before yesterday's meeting, [facility] has limited capacity and is first come, first served and she needed to consider alternatives.  She seems to have her heart set on [facility] going only to [facility] regardless.  Objective: Vital signs in last 24 hours: Temp:  [98.2 F (36.8 C)] 98.2 F (36.8 C) (04/01 2103) Pulse Rate:  [55] 55 (04/01 2103) Resp:  [18] 18 (04/01 2103) BP: (146)/(75) 146/75 mmHg (04/01 2103) SpO2:  [99 %] 99 % (04/01 2103) Weight change:  Last BM Date: 05/01/13  Intake/Output from previous day: 04/01 0701 - 04/02 0700 In: 0  Out: 1450 [Urine:1450] Intake/Output this shift:   General appearance-well-nourished lying in bed with facemask in place  No evidence of head trauma  Sclera anicteric, face is symmetric  Neck supple, no cervical lymphadenopathy  Lungs reveal diffuse rhonchi but no respiratory distress on oxygen  Heart: iiregularly irregular rhythm c/w afibAbdomen soft, nontender, nondistended bowel sounds present  Trace edema bilateral LE's  Neurologic exam: Slow to respond, no lateralizing signs, severe dementia at baseline. No improvement in his mental status.  Skin: Multiple hip, heel and sacral decubiti, dressed appropriately.   Lab Results: No results found for this basename: WBC, HGB, HCT, PLT,  in the last 72 hours BMET  Recent Labs  05/01/13 0316 05/02/13 0539  NA 140 142  K 3.8 4.3  CL 105 107  CO2 22 23  GLUCOSE 95 104*  BUN 20 15  CREATININE 1.19 1.12  CALCIUM 8.5 8.6    Studies/Results: Dg Chest Port 1 View  05/01/2013   CLINICAL DATA:  Followup aspiration  EXAM: PORTABLE CHEST - 1 VIEW   COMPARISON:  04/30/2013  FINDINGS: Cardiomediastinal silhouette is stable. Slight improvement in aeration. Persistent residual infiltrate left perihilar and lingula. Right lung is clear. Mild elevation of the right hemidiaphragm. No pulmonary edema. Stable degenerative changes thoracic spine.  IMPRESSION: Slight improvement in aeration. Persistent residual infiltrate left perihilar and lingula. Right lung is clear. Mild elevation of the right hemidiaphragm. No pulmonary edema. Stable degenerative changes thoracic spine.   Electronically Signed   By: Lahoma Crocker M.D.   On: 05/01/2013 08:04    Medications:  I have reviewed the patient's current medications. Scheduled: . collagenase   Topical Daily  . levETIRAcetam  500 mg Intravenous Q12H  . sodium chloride  3 mL Intravenous Q12H   Continuous: . dextrose 5 % and 0.45 % NaCl with KCl 20 mEq/L 20 mL/hr (05/02/13 1318)   LKT:GYBWLSLHTDSKA, bisacodyl, LORazepam, morphine injection  Assessment/Plan: ) Subarachnoid hemorrhage. He had been started on Xarelto the end of January by Cards during an admission for afib/RVR with a DVT being found. No evidence of trauma at this point, but his ambulation is poor as noted from his last office visit. Prior, he was wheelchair bound and has had decubiti as well. He has progressive dementia and is disoriented, which led to his placement at his current facility due to wife's continued work and [employer] needing 24 hour supervision. He is now comfort care only and meds removed other than for comfort and antiseizure at this  point. 2) Afib/RVR Will d/c tele at this point 3) Aspiration PNA- Zosynd/ced in keeping with comfort care. 4) Paget's Not on bisphosphonate due to being at facility, mild elevation of alk phos consistent with his long term disease.  5) Prostate CA- Not an issue at current, will watch voiding however.  6) Multiple decubiti Appreciate wound care consult for his many facility and activity related decubiti   7) SDAT- At his baseline, he cannot recognize me but is very good at confabulation.He is nonverbal now  Awaiting placement.  Casey Moore has to consider other non-[facility] facilities.  It would be helpful if we knew where he was on the list and if he is quite far down, this might have a better impression on her.  She is aware that availability only occurs when others at the facility pass on..  Forms have been completed and I have mentioned both [facility] and [facility], where other MD"s in my practice see patients as option.  Social work and palliative care will need to work with her to consider alternatives.  Casey Moore seems quite comfortable this AM.   LOS: 4 days   Shameeka Silliman W 05/03/2013, 7:45 AM

## 2013-05-03 NOTE — Progress Notes (Signed)
Pt to be d/c today to [facility] in [place].  Pt and family agreeable. Confirmed plans with facility  Plan transfer via EMS.  Glen Flora BSW-Intern [facility] 4N 1-16; 6N 1-16 [phone]

## 2013-05-04 NOTE — Consult Note (Signed)
I have reviewed this case with our NP and agree with the Assessment and Plan as stated.  Saray Capasso L. Janeva Peaster, MD MBA The Palliative Medicine Team at  Team Phone: [phone] Pager: [phone]   

## 2013-05-06 NOTE — ED Provider Notes (Signed)
I saw and evaluated the patient, reviewed the resident's note and I agree with the findings and plan.   .Face to face Exam:  General:  Unresponsive HEENT:  Atraumatic Resp:  Normal effort Abd:  Nondistended Neuro:Decreased level of consciousness  GCS=5  CRITICAL CARE Performed by: Leonard Schwartz L Total critical care time: 30 min Critical care time was exclusive of separately billable procedures and treating other patients. Critical care was necessary to treat or prevent imminent or life-threatening deterioration. Critical care was time spent personally by me on the following activities: development of treatment plan with patient and/or surrogate as well as nursing, discussions with consultants, evaluation of patient's response to treatment, examination of patient, obtaining history from patient or surrogate, ordering and performing treatments and interventions, ordering and review of laboratory studies, ordering and review of radiographic studies, pulse oximetry and re-evaluation of patient's condition.    Dot Lanes, MD 05/06/13 2225

## 2013-06-01 DEATH — deceased

## 2013-06-19 ENCOUNTER — Ambulatory Visit: Payer: 59 | Admitting: Internal Medicine

## 2013-09-07 ENCOUNTER — Telehealth: Payer: Self-pay

## 2013-09-07 NOTE — Telephone Encounter (Signed)
Patient died @ [facility] per [place]

## 2015-09-13 IMAGING — CR DG CHEST 1V PORT
1 series · 1 of 1 positions shown · non-contrast
Comparison: 02/23/2013

CLINICAL DATA: Atrial fibrillation. Followup left lower lobe
opacity.

EXAM:
PORTABLE CHEST - 1 VIEW

[AP]
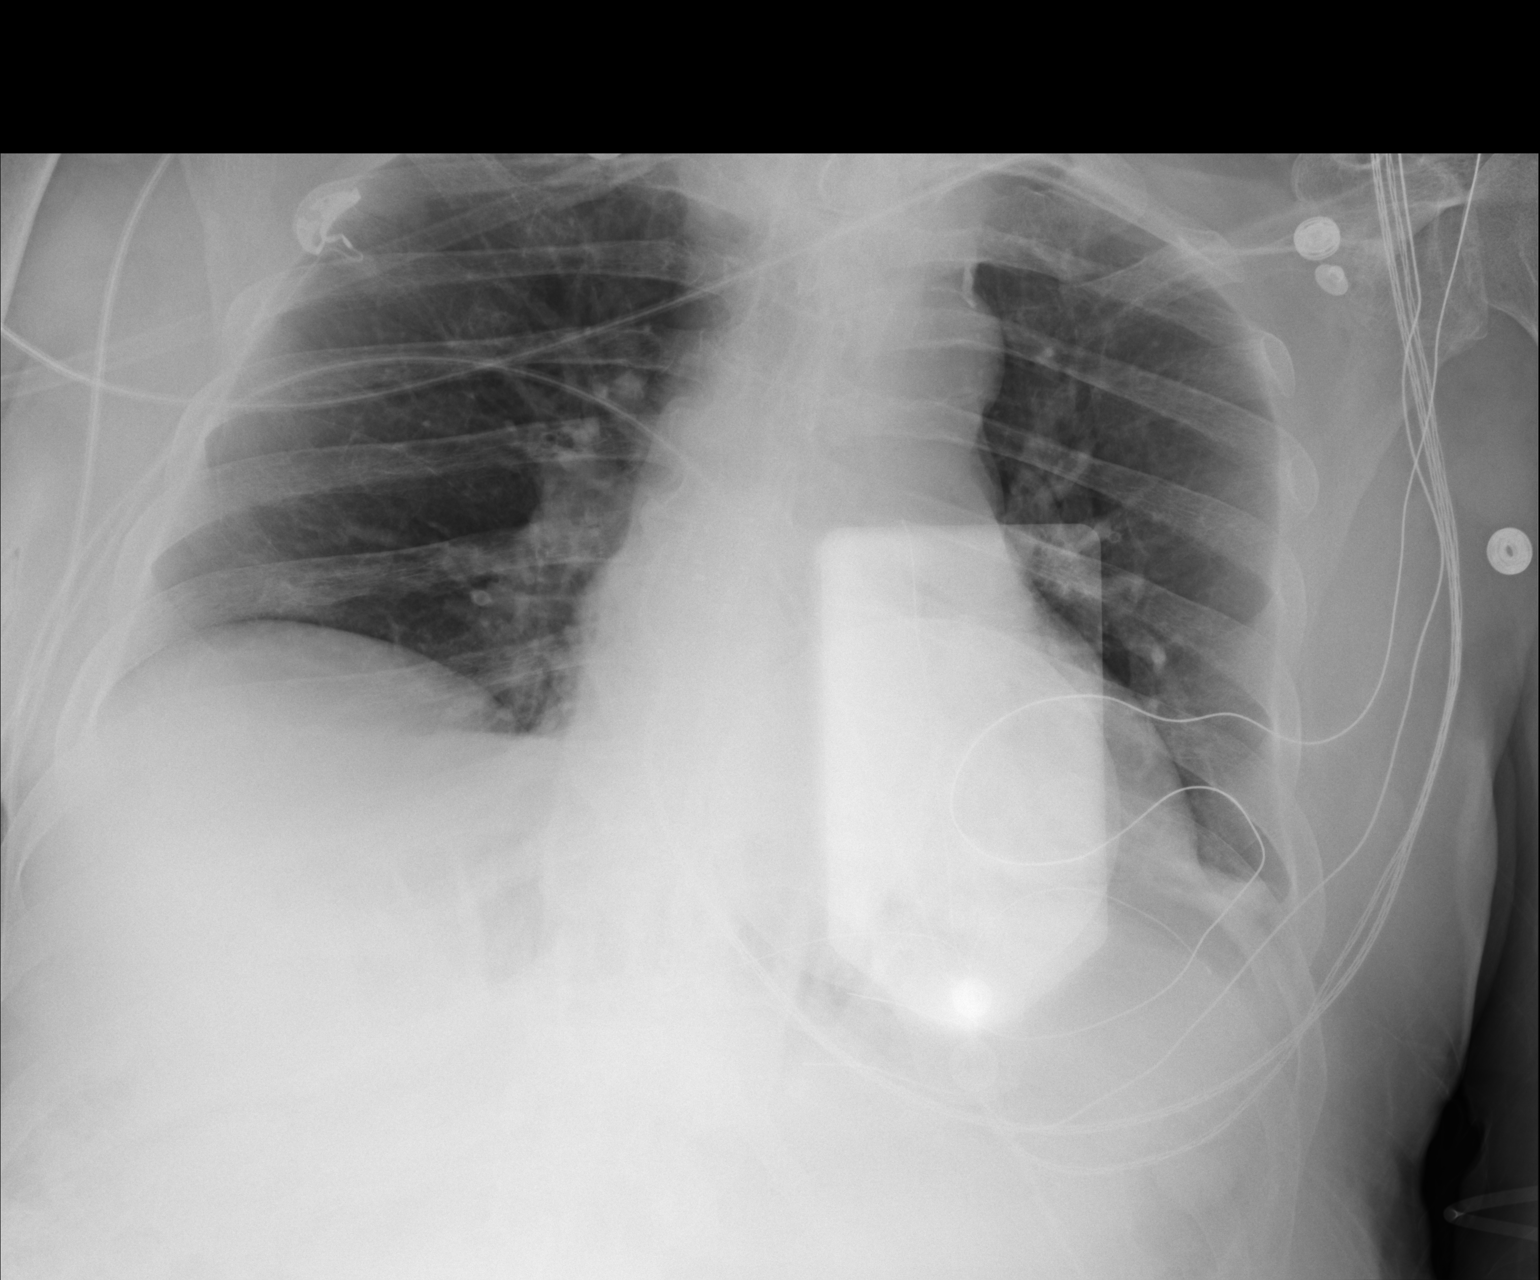

[1 of 1 positions shown; findings below may reference images not displayed]

FINDINGS: Left lung base suboptimally visualized due to position of overlying
defibrillator pad. Mild left retrocardiac opacity shows no definite
change, and may be due to atelectasis or infiltrate. Right lung is
clear. Heart size is normal.
IMPRESSION: Technically limited exam. No definite change in mild left basilar
atelectasis versus infiltrate.
